# Patient Record
Sex: Female | Born: 1970 | Race: Black or African American | Hispanic: No | Marital: Single | Smoking: Never smoker
Health system: Southern US, Community
[De-identification: ages and names within clinical notes are randomized; demographics above are authoritative.]

## PROBLEM LIST (undated history)

## (undated) DIAGNOSIS — F319 Bipolar disorder, unspecified: Secondary | ICD-10-CM

## (undated) DIAGNOSIS — M779 Enthesopathy, unspecified: Secondary | ICD-10-CM

## (undated) DIAGNOSIS — I10 Essential (primary) hypertension: Secondary | ICD-10-CM

## (undated) DIAGNOSIS — K219 Gastro-esophageal reflux disease without esophagitis: Secondary | ICD-10-CM

## (undated) DIAGNOSIS — F419 Anxiety disorder, unspecified: Secondary | ICD-10-CM

## (undated) DIAGNOSIS — F329 Major depressive disorder, single episode, unspecified: Secondary | ICD-10-CM

## (undated) DIAGNOSIS — I499 Cardiac arrhythmia, unspecified: Secondary | ICD-10-CM

## (undated) DIAGNOSIS — F4310 Post-traumatic stress disorder, unspecified: Secondary | ICD-10-CM

---

## 1993-08-04 HISTORY — PX: TUBAL LIGATION: SHX77

## 2002-08-04 DIAGNOSIS — F4310 Post-traumatic stress disorder, unspecified: Secondary | ICD-10-CM

## 2002-08-04 DIAGNOSIS — F419 Anxiety disorder, unspecified: Secondary | ICD-10-CM

## 2002-08-04 DIAGNOSIS — F319 Bipolar disorder, unspecified: Secondary | ICD-10-CM

## 2002-08-04 DIAGNOSIS — F32A Depression, unspecified: Secondary | ICD-10-CM

## 2002-08-04 HISTORY — DX: Depression, unspecified: F32.A

## 2002-08-04 HISTORY — DX: Post-traumatic stress disorder, unspecified: F43.10

## 2002-08-04 HISTORY — DX: Anxiety disorder, unspecified: F41.9

## 2002-08-04 HISTORY — DX: Bipolar disorder, unspecified: F31.9

## 2003-05-04 ENCOUNTER — Encounter: Admission: RE | Admit: 2003-05-04 | Discharge: 2003-05-04 | Payer: Self-pay | Admitting: Family Medicine

## 2003-08-09 ENCOUNTER — Inpatient Hospital Stay (HOSPITAL_COMMUNITY): Admission: AD | Admit: 2003-08-09 | Discharge: 2003-08-09 | Payer: Self-pay | Admitting: Obstetrics and Gynecology

## 2003-11-29 ENCOUNTER — Encounter: Admission: RE | Admit: 2003-11-29 | Discharge: 2003-11-29 | Payer: Self-pay | Admitting: Family Medicine

## 2004-03-19 ENCOUNTER — Encounter (INDEPENDENT_AMBULATORY_CARE_PROVIDER_SITE_OTHER): Payer: Self-pay | Admitting: *Deleted

## 2004-03-19 ENCOUNTER — Ambulatory Visit (HOSPITAL_COMMUNITY): Admission: RE | Admit: 2004-03-19 | Discharge: 2004-03-19 | Payer: Self-pay | Admitting: Orthopedic Surgery

## 2004-03-19 ENCOUNTER — Ambulatory Visit (HOSPITAL_BASED_OUTPATIENT_CLINIC_OR_DEPARTMENT_OTHER): Admission: RE | Admit: 2004-03-19 | Discharge: 2004-03-19 | Payer: Self-pay | Admitting: Orthopedic Surgery

## 2006-04-05 ENCOUNTER — Inpatient Hospital Stay (HOSPITAL_COMMUNITY): Admission: AD | Admit: 2006-04-05 | Discharge: 2006-04-05 | Payer: Self-pay | Admitting: Obstetrics and Gynecology

## 2006-05-14 ENCOUNTER — Ambulatory Visit: Payer: Self-pay | Admitting: Family Medicine

## 2006-06-04 ENCOUNTER — Ambulatory Visit: Payer: Self-pay | Admitting: Family Medicine

## 2006-06-23 ENCOUNTER — Ambulatory Visit (HOSPITAL_COMMUNITY): Admission: RE | Admit: 2006-06-23 | Discharge: 2006-06-23 | Payer: Self-pay | Admitting: Gynecology

## 2006-06-29 ENCOUNTER — Ambulatory Visit: Payer: Self-pay | Admitting: Gynecology

## 2006-07-30 ENCOUNTER — Ambulatory Visit: Payer: Self-pay | Admitting: Family Medicine

## 2006-08-24 ENCOUNTER — Ambulatory Visit: Payer: Self-pay | Admitting: Obstetrics & Gynecology

## 2006-09-07 ENCOUNTER — Ambulatory Visit: Payer: Self-pay | Admitting: Obstetrics & Gynecology

## 2006-09-28 ENCOUNTER — Ambulatory Visit: Payer: Self-pay | Admitting: Obstetrics & Gynecology

## 2006-10-01 DIAGNOSIS — F319 Bipolar disorder, unspecified: Secondary | ICD-10-CM

## 2006-10-12 ENCOUNTER — Ambulatory Visit: Payer: Self-pay | Admitting: Obstetrics & Gynecology

## 2006-10-26 ENCOUNTER — Ambulatory Visit: Payer: Self-pay | Admitting: Family Medicine

## 2006-11-02 ENCOUNTER — Ambulatory Visit: Payer: Self-pay | Admitting: Family Medicine

## 2006-11-09 ENCOUNTER — Ambulatory Visit: Payer: Self-pay | Admitting: Obstetrics & Gynecology

## 2006-11-14 ENCOUNTER — Ambulatory Visit: Payer: Self-pay | Admitting: Obstetrics & Gynecology

## 2006-11-14 ENCOUNTER — Inpatient Hospital Stay (HOSPITAL_COMMUNITY): Admission: AD | Admit: 2006-11-14 | Discharge: 2006-11-16 | Payer: Self-pay | Admitting: Gynecology

## 2007-04-01 ENCOUNTER — Ambulatory Visit: Payer: Self-pay | Admitting: Gynecology

## 2007-04-02 ENCOUNTER — Ambulatory Visit (HOSPITAL_COMMUNITY): Admission: RE | Admit: 2007-04-02 | Discharge: 2007-04-02 | Payer: Self-pay | Admitting: Family Medicine

## 2007-04-02 ENCOUNTER — Ambulatory Visit: Payer: Self-pay | Admitting: Family Medicine

## 2007-04-02 ENCOUNTER — Telehealth (INDEPENDENT_AMBULATORY_CARE_PROVIDER_SITE_OTHER): Payer: Self-pay | Admitting: *Deleted

## 2007-04-02 DIAGNOSIS — R079 Chest pain, unspecified: Secondary | ICD-10-CM | POA: Insufficient documentation

## 2007-04-06 ENCOUNTER — Encounter (INDEPENDENT_AMBULATORY_CARE_PROVIDER_SITE_OTHER): Payer: Self-pay | Admitting: Family Medicine

## 2007-04-15 ENCOUNTER — Encounter (INDEPENDENT_AMBULATORY_CARE_PROVIDER_SITE_OTHER): Payer: Self-pay | Admitting: Family Medicine

## 2007-05-06 ENCOUNTER — Encounter: Payer: Self-pay | Admitting: *Deleted

## 2007-08-04 ENCOUNTER — Ambulatory Visit: Payer: Self-pay | Admitting: Family Medicine

## 2007-08-04 ENCOUNTER — Encounter: Payer: Self-pay | Admitting: *Deleted

## 2007-11-26 ENCOUNTER — Emergency Department (HOSPITAL_COMMUNITY): Admission: EM | Admit: 2007-11-26 | Discharge: 2007-11-26 | Payer: Self-pay | Admitting: Emergency Medicine

## 2008-04-17 ENCOUNTER — Ambulatory Visit: Payer: Self-pay | Admitting: Family Medicine

## 2008-04-17 ENCOUNTER — Other Ambulatory Visit: Admission: RE | Admit: 2008-04-17 | Discharge: 2008-04-17 | Payer: Self-pay | Admitting: Family Medicine

## 2008-04-17 ENCOUNTER — Encounter (INDEPENDENT_AMBULATORY_CARE_PROVIDER_SITE_OTHER): Payer: Self-pay | Admitting: Family Medicine

## 2008-04-17 ENCOUNTER — Encounter: Payer: Self-pay | Admitting: Family Medicine

## 2008-04-17 LAB — CONVERTED CEMR LAB
Beta hcg, urine, semiquantitative: NEGATIVE
HCT: 38.7 % (ref 36.0–46.0)
Hemoglobin: 12.2 g/dL (ref 12.0–15.0)
MCHC: 31.5 g/dL (ref 30.0–36.0)
MCV: 89.6 fL (ref 78.0–100.0)
Platelets: 304 10*3/uL (ref 150–400)
RBC: 4.32 M/uL (ref 3.87–5.11)
RDW: 14.3 % (ref 11.5–15.5)
TSH: 2.177 microintl units/mL (ref 0.350–4.50)
WBC: 5.3 10*3/uL (ref 4.0–10.5)

## 2008-04-18 ENCOUNTER — Encounter: Payer: Self-pay | Admitting: *Deleted

## 2008-04-21 ENCOUNTER — Encounter (INDEPENDENT_AMBULATORY_CARE_PROVIDER_SITE_OTHER): Payer: Self-pay | Admitting: *Deleted

## 2008-05-19 ENCOUNTER — Encounter (INDEPENDENT_AMBULATORY_CARE_PROVIDER_SITE_OTHER): Payer: Self-pay | Admitting: *Deleted

## 2008-05-30 ENCOUNTER — Telehealth (INDEPENDENT_AMBULATORY_CARE_PROVIDER_SITE_OTHER): Payer: Self-pay | Admitting: Family Medicine

## 2008-06-06 ENCOUNTER — Encounter (INDEPENDENT_AMBULATORY_CARE_PROVIDER_SITE_OTHER): Payer: Self-pay | Admitting: Family Medicine

## 2008-06-06 ENCOUNTER — Ambulatory Visit: Payer: Self-pay | Admitting: Family Medicine

## 2008-06-06 LAB — CONVERTED CEMR LAB
Chlamydia, DNA Probe: NEGATIVE
GC Probe Amp, Genital: NEGATIVE
Whiff Test: POSITIVE

## 2008-06-07 ENCOUNTER — Encounter (INDEPENDENT_AMBULATORY_CARE_PROVIDER_SITE_OTHER): Payer: Self-pay | Admitting: Family Medicine

## 2009-04-17 ENCOUNTER — Telehealth: Payer: Self-pay | Admitting: Family Medicine

## 2009-04-20 ENCOUNTER — Ambulatory Visit: Payer: Self-pay | Admitting: Family Medicine

## 2009-05-04 ENCOUNTER — Encounter: Payer: Self-pay | Admitting: Family Medicine

## 2009-05-04 ENCOUNTER — Other Ambulatory Visit: Admission: RE | Admit: 2009-05-04 | Discharge: 2009-05-04 | Payer: Self-pay | Admitting: Family Medicine

## 2009-05-04 ENCOUNTER — Ambulatory Visit: Payer: Self-pay | Admitting: Family Medicine

## 2009-05-04 DIAGNOSIS — R102 Pelvic and perineal pain: Secondary | ICD-10-CM

## 2009-05-04 DIAGNOSIS — G8929 Other chronic pain: Secondary | ICD-10-CM

## 2009-05-04 LAB — CONVERTED CEMR LAB
BUN: 6 mg/dL (ref 6–23)
Beta hcg, urine, semiquantitative: NEGATIVE
CO2: 25 meq/L (ref 19–32)
Calcium: 9.8 mg/dL (ref 8.4–10.5)
Chlamydia, DNA Probe: NEGATIVE
Chloride: 103 meq/L (ref 96–112)
Creatinine, Ser: 0.99 mg/dL (ref 0.40–1.20)
GC Probe Amp, Genital: NEGATIVE
Glucose, Bld: 95 mg/dL (ref 70–99)
HCT: 37 % (ref 36.0–46.0)
Hemoglobin: 11.9 g/dL — ABNORMAL LOW (ref 12.0–15.0)
MCHC: 32.2 g/dL (ref 30.0–36.0)
MCV: 87.3 fL (ref 78.0–100.0)
Platelets: 281 10*3/uL (ref 150–400)
Potassium: 4.7 meq/L (ref 3.5–5.3)
RBC: 4.24 M/uL (ref 3.87–5.11)
RDW: 15.7 % — ABNORMAL HIGH (ref 11.5–15.5)
Sodium: 137 meq/L (ref 135–145)
WBC: 4.7 10*3/uL (ref 4.0–10.5)
Whiff Test: POSITIVE

## 2009-05-06 ENCOUNTER — Encounter: Payer: Self-pay | Admitting: Family Medicine

## 2009-05-10 ENCOUNTER — Encounter: Payer: Self-pay | Admitting: Family Medicine

## 2009-05-10 ENCOUNTER — Ambulatory Visit (HOSPITAL_COMMUNITY): Admission: RE | Admit: 2009-05-10 | Discharge: 2009-05-10 | Payer: Self-pay | Admitting: Family Medicine

## 2009-05-30 ENCOUNTER — Telehealth: Payer: Self-pay | Admitting: Family Medicine

## 2009-08-20 ENCOUNTER — Telehealth: Payer: Self-pay | Admitting: Family Medicine

## 2009-08-21 ENCOUNTER — Ambulatory Visit: Payer: Self-pay | Admitting: Family Medicine

## 2009-08-21 ENCOUNTER — Encounter: Payer: Self-pay | Admitting: Family Medicine

## 2009-08-21 ENCOUNTER — Ambulatory Visit (HOSPITAL_COMMUNITY)
Admission: RE | Admit: 2009-08-21 | Discharge: 2009-08-21 | Payer: Self-pay | Source: Home / Self Care | Admitting: Family Medicine

## 2009-08-21 DIAGNOSIS — R0789 Other chest pain: Secondary | ICD-10-CM | POA: Insufficient documentation

## 2009-08-23 ENCOUNTER — Telehealth: Payer: Self-pay | Admitting: Family Medicine

## 2009-11-26 ENCOUNTER — Telehealth: Payer: Self-pay | Admitting: Family Medicine

## 2009-11-26 ENCOUNTER — Ambulatory Visit: Payer: Self-pay | Admitting: Family Medicine

## 2010-08-12 ENCOUNTER — Inpatient Hospital Stay (HOSPITAL_COMMUNITY)
Admission: AD | Admit: 2010-08-12 | Discharge: 2010-08-12 | Payer: Self-pay | Source: Home / Self Care | Attending: Obstetrics and Gynecology | Admitting: Obstetrics and Gynecology

## 2010-08-17 ENCOUNTER — Emergency Department (HOSPITAL_COMMUNITY)
Admission: EM | Admit: 2010-08-17 | Discharge: 2010-08-17 | Payer: Self-pay | Source: Home / Self Care | Admitting: Emergency Medicine

## 2010-08-19 LAB — POCT PREGNANCY, URINE: Preg Test, Ur: NEGATIVE

## 2010-08-19 LAB — URINE MICROSCOPIC-ADD ON

## 2010-08-19 LAB — GC/CHLAMYDIA PROBE AMP, GENITAL
Chlamydia, DNA Probe: NEGATIVE
GC Probe Amp, Genital: POSITIVE — AB

## 2010-08-19 LAB — URINALYSIS, ROUTINE W REFLEX MICROSCOPIC
Bilirubin Urine: NEGATIVE
Hgb urine dipstick: NEGATIVE
Ketones, ur: 15 mg/dL — AB
Nitrite: POSITIVE — AB
Protein, ur: 100 mg/dL — AB
Specific Gravity, Urine: 1.025 (ref 1.005–1.030)
Urine Glucose, Fasting: 250 mg/dL — AB
Urobilinogen, UA: >8 mg/dL — ABNORMAL HIGH (ref 0.0–1.0)
pH: 5 (ref 5.0–8.0)

## 2010-08-19 LAB — WET PREP, GENITAL
Trich, Wet Prep: NONE SEEN
Yeast Wet Prep HPF POC: NONE SEEN

## 2010-08-19 LAB — GLUCOSE, CAPILLARY: Glucose-Capillary: 98 mg/dL (ref 70–99)
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
## 2010-09-03 NOTE — Progress Notes (Signed)
Summary: triage  Phone Note Call from Patient Call back at [phone]   Caller: Patient Summary of Call: Has cold asking to be seen today. Initial call taken by: Clydell Hakim,  November 26, 2009 11:05 AM  Follow-up for Phone Call        COLD SYMPTOMS X 3 WEEKS. work in at [employer]. aware of wait Follow-up by: Golden Circle RN,  November 26, 2009 11:08 AM
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
## 2010-09-03 NOTE — Progress Notes (Signed)
Summary: x-ray results  Phone Note Call from Patient Call back at [phone]   Caller: Patient Summary of Call: Pt checking on results of x-ray. Initial call taken by: Clydell Hakim,  August 23, 2009 3:46 PM  Follow-up for Phone Call        will forward to MD. Follow-up by: Theresia Lo RN,  August 23, 2009 3:55 PM  Additional Follow-up for Phone Call Additional follow up Details #1::        called patient and discussed xray report. Additional Follow-up by: Marisue Ivan  MD,  August 23, 2009 5:36 PM

## 2010-09-03 NOTE — Progress Notes (Signed)
Summary: triage  Phone Note Call from Patient Call back at [phone]   Caller: Patient Summary of Call: Pt has real bad cough and wondering what she should do about it? Initial call taken by: Clydell Hakim,  August 20, 2009 4:28 PM  Follow-up for Phone Call        cough x 3 days. taking otc & rx cough meds. to see pcp tomorrow am. told her she can sips fluids frequently, suck on hard candy or cough drops. 1 teaspoon hiney every 4 hours Follow-up by: Golden Circle RN,  August 20, 2009 4:30 PM

## 2010-10-23 ENCOUNTER — Encounter: Payer: Self-pay | Admitting: Family Medicine

## 2010-11-06 ENCOUNTER — Encounter: Payer: Self-pay | Admitting: Family Medicine

## 2010-11-06 ENCOUNTER — Ambulatory Visit (INDEPENDENT_AMBULATORY_CARE_PROVIDER_SITE_OTHER): Payer: Medicaid Other | Admitting: Family Medicine

## 2010-11-06 VITALS — BP 113/80 | HR 97 | Temp 97.9°F | Ht 66.6 in | Wt 172.0 lb

## 2010-11-06 DIAGNOSIS — E663 Overweight: Secondary | ICD-10-CM | POA: Insufficient documentation

## 2010-11-06 DIAGNOSIS — N760 Acute vaginitis: Secondary | ICD-10-CM

## 2010-11-06 DIAGNOSIS — Z01419 Encounter for gynecological examination (general) (routine) without abnormal findings: Secondary | ICD-10-CM

## 2010-11-06 DIAGNOSIS — N920 Excessive and frequent menstruation with regular cycle: Secondary | ICD-10-CM

## 2010-11-06 DIAGNOSIS — F319 Bipolar disorder, unspecified: Secondary | ICD-10-CM

## 2010-11-06 DIAGNOSIS — Z113 Encounter for screening for infections with a predominantly sexual mode of transmission: Secondary | ICD-10-CM

## 2010-11-06 DIAGNOSIS — R109 Unspecified abdominal pain: Secondary | ICD-10-CM

## 2010-11-06 DIAGNOSIS — Z309 Encounter for contraceptive management, unspecified: Secondary | ICD-10-CM

## 2010-11-06 LAB — COMPREHENSIVE METABOLIC PANEL
ALT: 11 U/L (ref 0–35)
AST: 14 U/L (ref 0–37)
Albumin: 3.6 g/dL (ref 3.5–5.2)
Alkaline Phosphatase: 68 U/L (ref 39–117)
BUN: 11 mg/dL (ref 6–23)
CO2: 21 mEq/L (ref 19–32)
Calcium: 8.9 mg/dL (ref 8.4–10.5)
Chloride: 110 mEq/L (ref 96–112)
Creat: 0.88 mg/dL (ref 0.40–1.20)
Glucose, Bld: 86 mg/dL (ref 70–99)
Potassium: 4.2 mEq/L (ref 3.5–5.3)
Sodium: 140 mEq/L (ref 135–145)
Total Bilirubin: 0.1 mg/dL — ABNORMAL LOW (ref 0.3–1.2)
Total Protein: 5.9 g/dL — ABNORMAL LOW (ref 6.0–8.3)

## 2010-11-06 LAB — POCT URINE PREGNANCY: Preg Test, Ur: NEGATIVE

## 2010-11-06 LAB — LIPID PANEL
Cholesterol: 151 mg/dL (ref 0–200)
HDL: 56 mg/dL (ref >39)
LDL Cholesterol: 72 mg/dL (ref 0–99)
Total CHOL/HDL Ratio: 2.7 Ratio
Triglycerides: 113 mg/dL (ref <150)
VLDL: 23 mg/dL (ref 0–40)

## 2010-11-06 LAB — CBC
HCT: 35 % — ABNORMAL LOW (ref 36.0–46.0)
Hemoglobin: 11 g/dL — ABNORMAL LOW (ref 12.0–15.0)
MCH: 27.2 pg (ref 26.0–34.0)
MCHC: 31.4 g/dL (ref 30.0–36.0)
MCV: 86.4 fL (ref 78.0–100.0)
Platelets: 223 10*3/uL (ref 150–400)
RBC: 4.05 MIL/uL (ref 3.87–5.11)
RDW: 19.7 % — ABNORMAL HIGH (ref 11.5–15.5)
WBC: 7.1 10*3/uL (ref 4.0–10.5)

## 2010-11-06 LAB — RPR

## 2010-11-06 LAB — TSH: TSH: 2.279 u[IU]/mL (ref 0.350–4.500)

## 2010-11-06 MED ORDER — NORGESTIMATE-ETH ESTRADIOL 0.25-35 MG-MCG PO TABS: 1.0000 | ORAL_TABLET | Freq: Every day | ORAL | Status: DC

## 2010-11-06 NOTE — Assessment & Plan Note (Signed)
Norma pap less than 2 years ago.  Will repeat at next annual gynecological visit.

## 2010-11-06 NOTE — Assessment & Plan Note (Signed)
History of Mirena intolerance due to breakthrough bleeding.  Will start OCP for regulation of heavy menstrual cycle as well as patient states her last child was s/p BTL and desires contraception.

## 2010-11-06 NOTE — Patient Instructions (Addendum)
Keep up on the exercise and work on healthy nutrition  We talked about the risks of illegal drugs today New birth control- will be sent to your pharmacy Use condoms- birth control or BTL does not protect against sexually transmitted diseases

## 2010-11-06 NOTE — Progress Notes (Signed)
  Subjective:    Patient ID: Krystal West, female    DOB: 1971/05/02, 40 y.o.   MRN: [id number]  HPI Annual Gynecological Exam  [id number] Wt Readings from Last 3 Encounters:  11/06/10 172 lb (78.019 kg)  11/26/09 152 lb 8 oz (69.174 kg)  08/21/09 157 lb 11.2 oz (71.532 kg)   Last period: 10/30/2010- March 16th Regular periods: yes motnhly Heavy bleeding: yes, 10 days, clots  Sexually active: yes Birth control or hormonal therapy:no, Hx of STD: Patient desires STD screening Vaginal discharge:no  Dysuria:No   Last mammogram: no Breast mass or concerns: No  Last Pap: October 2010, no abnormals History of abnormal pap: No   FH of breast, uterine, ovarian, colon cancer:   Desires birth control because has been on mirena and had pelvic pain and bleeding even after 6 months.  Forgets to take birth control pills.  States needs irth control even with  BTL as her last child was post BTL   Review of Systems See HPI    Objective:   Physical Exam  Constitutional: She is oriented to person, place, and time. She appears well-developed and well-nourished.  HENT:  Head: Normocephalic and atraumatic.  Right Ear: External ear normal.  Left Ear: External ear normal.  Nose: Nose normal.  Mouth/Throat: Oropharynx is clear and moist. No oropharyngeal exudate.  Eyes: Conjunctivae and EOM are normal. Pupils are equal, round, and reactive to light.  Neck: Normal range of motion. Neck supple. No thyromegaly present.  Cardiovascular: Normal rate, regular rhythm and normal heart sounds.   No murmur heard. Pulmonary/Chest: Effort normal and breath sounds normal. No respiratory distress. She has no wheezes. She has no rhonchi. She has no rales.  Abdominal: Soft. Bowel sounds are normal. There is no hepatosplenomegaly. There is no tenderness. There is no rebound and no guarding.  Genitourinary: Vagina normal and uterus normal. No breast tenderness or discharge. Cervix exhibits no motion  tenderness, no discharge and no friability. Right adnexum displays no mass, no tenderness and no fullness. Left adnexum displays no mass and no tenderness. No bleeding around the vagina. No vaginal discharge found.  Musculoskeletal: She exhibits no edema.  Lymphadenopathy:    She has no cervical adenopathy.  Neurological: She is alert and oriented to person, place, and time.  Psychiatric: She has a normal mood and affect.          Assessment & Plan:

## 2010-11-06 NOTE — Assessment & Plan Note (Signed)
Recent weight gain due to increase an seroquel and other psychiatric medications per patient .  Encouraged to continue taking them ut to be mindful of increased appetite and to continue physical exercise.  Will check Lipid and fasting glucose as at risk for metabolic syndrome.

## 2010-11-06 NOTE — Progress Notes (Signed)
Pt is wanting to start birth control

## 2010-11-07 LAB — HIV ANTIBODY (ROUTINE TESTING W REFLEX): HIV: NONREACTIVE

## 2010-11-07 LAB — GC/CHLAMYDIA PROBE AMP, GENITAL
Chlamydia, DNA Probe: NEGATIVE
GC Probe Amp, Genital: NEGATIVE

## 2010-11-15 ENCOUNTER — Telehealth: Payer: Self-pay | Admitting: Family Medicine

## 2010-11-15 NOTE — Telephone Encounter (Signed)
Wants to know results of last week

## 2010-11-18 NOTE — Telephone Encounter (Signed)
Pt is still waiting for her results.  It's going on 2 wks and she is quite anxious about them.  Please return call for this asap

## 2010-11-18 NOTE — Telephone Encounter (Signed)
I won't be in the office today or tomorrow.  Would you please call patient and let her know all labs are normal  including her thyroid, liver function, HIV, syphilis, Gonorrhea, chlamydia, negative pregnancy.  She was minimally anemic as expected due to heavy periods.  Can take a daily multivitamin and we will recheck at some point in the future.  Thanks

## 2010-11-19 NOTE — Telephone Encounter (Signed)
LVM for patient to call back to inform of below 

## 2010-11-19 NOTE — Telephone Encounter (Signed)
Called pt again and lvm.Krystal West

## 2010-11-19 NOTE — Telephone Encounter (Signed)
Pt informed of results.Krystal West  

## 2010-11-27 ENCOUNTER — Telehealth: Payer: Self-pay | Admitting: Family Medicine

## 2010-11-27 NOTE — Telephone Encounter (Signed)
Has taken the 3 wks of active pills. Told her to start the inactive pills & I will call her with md response

## 2010-11-27 NOTE — Telephone Encounter (Signed)
Is on Sprintec and she has been bleeding almost 3 weeks.  Not sure what to do.

## 2010-12-02 NOTE — Telephone Encounter (Signed)
Gave pt md response. She was ok with the answer

## 2010-12-02 NOTE — Telephone Encounter (Signed)
Agreed.  She should continue to take pills, and I would expect to see lighter periods.  Stress importance of not missing days and taking pills at roughly the same time daily as both can cause unscheduled bleeding.

## 2010-12-10 ENCOUNTER — Encounter: Payer: Self-pay | Admitting: Family Medicine

## 2010-12-10 ENCOUNTER — Ambulatory Visit (INDEPENDENT_AMBULATORY_CARE_PROVIDER_SITE_OTHER): Payer: Medicaid Other | Admitting: Family Medicine

## 2010-12-10 VITALS — BP 110/76 | Temp 98.4°F | Ht 66.5 in | Wt 168.0 lb

## 2010-12-10 DIAGNOSIS — Z309 Encounter for contraceptive management, unspecified: Secondary | ICD-10-CM

## 2010-12-10 DIAGNOSIS — J329 Chronic sinusitis, unspecified: Secondary | ICD-10-CM

## 2010-12-10 DIAGNOSIS — IMO0001 Reserved for inherently not codable concepts without codable children: Secondary | ICD-10-CM

## 2010-12-10 DIAGNOSIS — S30810A Abrasion of lower back and pelvis, initial encounter: Secondary | ICD-10-CM

## 2010-12-10 DIAGNOSIS — E663 Overweight: Secondary | ICD-10-CM

## 2010-12-10 MED ORDER — DOXYCYCLINE HYCLATE 100 MG PO TABS: 100.0000 mg | ORAL_TABLET | Freq: Two times a day (BID) | ORAL | Status: AC

## 2010-12-10 MED ORDER — NYSTATIN-TRIAMCINOLONE 100000-0.1 UNIT/GM-% EX OINT: TOPICAL_OINTMENT | Freq: Two times a day (BID) | CUTANEOUS | Status: DC

## 2010-12-10 NOTE — Patient Instructions (Addendum)
   For the bleeding give it 3 months, do not miss pills, if still bleeding then, follow up with Dr. Earnest Bailey For you sinus infection use the antibiotic for 10 days.  For the skin irritation use the ointment bid until it heals

## 2010-12-10 NOTE — Progress Notes (Signed)
  Subjective:    Patient ID: Krystal West, female    DOB: 1971/03/23, 40 y.o.   MRN: [id number]  HPI  Coughing for one month, productive greenish and sometimes brown.  Children have been sick but improved. Does not smoke.  Noticed some swelling around knees on both legs.  Has been exercising to loose weight.  Scratched skin around her rectum and now painful and burning.  Started OPC last month and has been spotting and bleeding since, started with menses.    Review of Systems  Constitutional: Negative for fever and unexpected weight change.  HENT: Positive for postnasal drip and sinus pressure. Negative for ear pain, congestion and rhinorrhea.   Respiratory: Positive for cough. Negative for shortness of breath and wheezing.   Cardiovascular: Negative for chest pain and leg swelling.  Musculoskeletal: Negative for arthralgias.       Objective:   Physical Exam  Constitutional: She appears well-developed and well-nourished.  HENT:       TM retracted,  + post nasal drainage  Cardiovascular: Normal rate and regular rhythm.   Pulmonary/Chest: Effort normal and breath sounds normal.  Genitourinary:       Excoriation of rectal area, no obvious tags, stool heme negative  Musculoskeletal: Normal range of motion.       Tissue bogginess around the knees, non pitting, normal ROM, normal joint exam  Lymphadenopathy:    She has no cervical adenopathy.          Assessment & Plan:

## 2010-12-11 DIAGNOSIS — IMO0001 Reserved for inherently not codable concepts without codable children: Secondary | ICD-10-CM | POA: Insufficient documentation

## 2010-12-11 NOTE — Assessment & Plan Note (Signed)
Explained need to stay with the plan for 3 months, and spotting will likely stop, return if it does not.

## 2010-12-11 NOTE — Assessment & Plan Note (Signed)
Doxycycline 100 mg bid for 10 days

## 2010-12-11 NOTE — Assessment & Plan Note (Signed)
Topical antifungal/cortisone ointment for one week

## 2010-12-11 NOTE — Assessment & Plan Note (Signed)
Bogginess around knees appeared to be fat pads, they were symmetrical and soft, in the face of a normal joint exam

## 2010-12-20 NOTE — Op Note (Signed)
Krystal West, Krystal West                       ACCOUNT NO.:  [id number]   MEDICAL RECORD NO.:  [id number]                   PATIENT TYPE:  AMB   LOCATION:  [facility]                                  FACILITY:  [facility]   PHYSICIAN:  Cindee Salt, M.D.                    DATE OF BIRTH:  [birth year]   DATE OF PROCEDURE:  03/19/2004  DATE OF DISCHARGE:                                 OPERATIVE REPORT   PREOPERATIVE DIAGNOSIS:  Cyst volar radial aspect right wrist.   POSTOPERATIVE DIAGNOSIS:  Cyst volar radial aspect right wrist.   OPERATION:  Excision cyst volar radial aspect right wrist.   SURGEON:  Kuzma.   ASSISTANT:  __________.   ANESTHESIA:  General.   HISTORY:  The patient is a 40 year old female with a history of a mass on  the volar radial aspect of her right wrist which is painful for her.   PROCEDURE:  The patient is brought to the operating room where a general  anesthetic was carried out without difficulty.  She was prepped and draped  using DuraPrep, supine position, right arm free.  The limb was exsanguinated  with an Esmarch bandage, tourniquet placed high and the arm was inflated to  250 mmHg.  A volar hockey stick incision was made over the mass, carried  through subcutaneous tissue.  Bleeders were electrocauterized.  The  dissection was carried down to a multilobulated cystic structure.  With  blunt and sharp dissection this was dissected free, protecting the  superficial radial artery.  This was found to follow up along the flexor  carpi radialis sheath.  This was followed distally, excising the entire  cyst.  This was sent to pathology. This appeared arise from the __________  on the volar aspect and this was excised, the area opened, minimally  debrided.  The wound was irrigated.  The skin was closed with interrupted 5-  0 nylon sutures.  A sterile compressive dressing and thumb spica splint  applied.  The patient tolerated the procedure well and was taken to the  recovery room for observation in satisfactory condition.  She is discharged  home, to return to __________ [facility] in one week, on Vicodin.                                               Cindee Salt, M.D.    GK/MEDQ  D:  03/19/2004  T:  03/19/2004  Job:  [id number]

## 2010-12-27 ENCOUNTER — Telehealth: Payer: Self-pay | Admitting: Family Medicine

## 2010-12-27 NOTE — Telephone Encounter (Signed)
Pt is returning someone's call, not sure who called her.  °

## 2010-12-27 NOTE — Telephone Encounter (Signed)
No one called her from our office.Krystal West, Krystal West

## 2011-01-13 ENCOUNTER — Ambulatory Visit: Payer: Medicaid Other | Admitting: Family Medicine

## 2011-02-19 ENCOUNTER — Encounter: Payer: Self-pay | Admitting: Family Medicine

## 2011-02-19 ENCOUNTER — Ambulatory Visit (INDEPENDENT_AMBULATORY_CARE_PROVIDER_SITE_OTHER): Payer: Medicaid Other | Admitting: Family Medicine

## 2011-02-19 DIAGNOSIS — M7750 Other enthesopathy of unspecified foot: Secondary | ICD-10-CM

## 2011-02-19 DIAGNOSIS — M7740 Metatarsalgia, unspecified foot: Secondary | ICD-10-CM

## 2011-02-19 NOTE — Progress Notes (Signed)
  Subjective:    Patient ID: Krystal West, female    DOB: 1971-05-13, 40 y.o.   MRN: [id number]  HPI Foot pain bilateral: X 2 weeks, worse at night, right foot hurts worse than left.  Pain is located on bottom of foot, most intense on the ball of the foot but radiates to the middle of the sole of the foot as well.  Worse with prolonged standing.  Improved with sitting and putting feet up.  Pt has taken friends vicodin and oxycodone b/c pain has been so severe.  No trauma to area.  No redness. No swelling.  No fever. No lesions. No rashes  Review of Systems As per above    Objective:   Physical Exam  Musculoskeletal:       Pain on palpation on ball of forefoot bilateral.  No redness. No swelling.  No lesions. No rashes.  Sensation grossly intact.  Full rom bilateral.  Collapse of transverse arch of forefoot bilateral.           Assessment & Plan:

## 2011-02-23 DIAGNOSIS — M7740 Metatarsalgia, unspecified foot: Secondary | ICD-10-CM | POA: Insufficient documentation

## 2011-02-23 NOTE — Assessment & Plan Note (Signed)
Pt instructed to buy metatarsalgia pad at supply store (not in stock in our office), pt to take tylenol scheduled and tramadol for breakthrough pain.

## 2011-07-02 ENCOUNTER — Encounter: Payer: Self-pay | Admitting: Family Medicine

## 2011-07-02 ENCOUNTER — Ambulatory Visit (INDEPENDENT_AMBULATORY_CARE_PROVIDER_SITE_OTHER): Payer: Medicaid Other | Admitting: Family Medicine

## 2011-07-02 VITALS — BP 126/91 | HR 76 | Temp 98.5°F | Ht 66.5 in | Wt 187.8 lb

## 2011-07-02 DIAGNOSIS — M7750 Other enthesopathy of unspecified foot: Secondary | ICD-10-CM

## 2011-07-02 DIAGNOSIS — M7740 Metatarsalgia, unspecified foot: Secondary | ICD-10-CM

## 2011-07-02 MED ORDER — IBUPROFEN 600 MG PO TABS: 600.0000 mg | ORAL_TABLET | Freq: Three times a day (TID) | ORAL | Status: DC | PRN

## 2011-07-02 NOTE — Progress Notes (Signed)
  Subjective:    Patient ID: Krystal West, female    DOB: 30-Apr-1971, 40 y.o.   MRN: [id number]  HPI  FOOT PAIN Location: bilateral base of toes   Course:  Comes and goes has had before. Worse with:  walking Better with:  Taking her friends pain pills not sure what they were Trauma: no Swelling:  no Locking:  no Other Joints involved:  no Rash: no Fever:  No Wears flat shoes without insoles, no high heels  Review of Symptoms - see HPI  PMH - Smoking status noted.      Review of Systems     Objective:   Physical Exam  Tender over metatarsals No deformity but does have flat spread feet over metatarsal region No soft tissue swelling       Assessment & Plan:

## 2011-07-02 NOTE — Assessment & Plan Note (Signed)
No signs of trauma or DJD.   Her current shoes have no support.  Emphasized to wear supports all the time and as needed ibuprofen.

## 2011-07-02 NOTE — Patient Instructions (Signed)
You have metatarsalgia  The best treatment is arch support insoles - Dr Jabier Mutton or a similar brand  Wear them all the time   Use the ibuprofen as needed for pain

## 2011-08-21 ENCOUNTER — Ambulatory Visit (INDEPENDENT_AMBULATORY_CARE_PROVIDER_SITE_OTHER): Payer: Medicaid Other | Admitting: Family Medicine

## 2011-08-21 ENCOUNTER — Encounter: Payer: Self-pay | Admitting: Family Medicine

## 2011-08-21 VITALS — BP 100/70 | HR 80 | Temp 97.5°F | Ht 66.5 in | Wt 190.2 lb

## 2011-08-21 DIAGNOSIS — IMO0001 Reserved for inherently not codable concepts without codable children: Secondary | ICD-10-CM

## 2011-08-21 DIAGNOSIS — B9689 Other specified bacterial agents as the cause of diseases classified elsewhere: Secondary | ICD-10-CM | POA: Insufficient documentation

## 2011-08-21 DIAGNOSIS — Z309 Encounter for contraceptive management, unspecified: Secondary | ICD-10-CM

## 2011-08-21 DIAGNOSIS — N760 Acute vaginitis: Secondary | ICD-10-CM | POA: Insufficient documentation

## 2011-08-21 DIAGNOSIS — A499 Bacterial infection, unspecified: Secondary | ICD-10-CM

## 2011-08-21 LAB — POCT WET PREP (WET MOUNT): Trichomonas Wet Prep HPF POC: NEGATIVE

## 2011-08-21 MED ORDER — METRONIDAZOLE 500 MG PO TABS: 500.0000 mg | ORAL_TABLET | Freq: Two times a day (BID) | ORAL | Status: AC

## 2011-08-21 MED ORDER — FLUCONAZOLE 150 MG PO TABS: 150.0000 mg | ORAL_TABLET | Freq: Once | ORAL | Status: AC

## 2011-08-21 MED ORDER — MEDROXYPROGESTERONE ACETATE 150 MG/ML IM SUSP
150.0000 mg | Freq: Once | INTRAMUSCULAR | Status: AC
  Administered 2011-08-21: 150 mg via INTRAMUSCULAR

## 2011-08-21 NOTE — Patient Instructions (Signed)
Depo for birth control  Will set you up with gynecologist to discuss permanent options for birth control  Use condoms every time- they protect you against infections

## 2011-08-21 NOTE — Assessment & Plan Note (Signed)
Discussed options with patient.  She had BTL and after that had an unplanned pregnancy.  She continues to desire permanent sterility and has failed Mirena and OCP.    Will refer to gyn to discuss options and in the mean time will give depo today until able to discuss long term options.

## 2011-08-21 NOTE — Assessment & Plan Note (Signed)
Gave patient info on BV and yeast vaginitis and will treat for both.  Discussed risky sexual behavior and need to evaluate HIV, RPR.  Has testing in 2012.

## 2011-08-21 NOTE — Progress Notes (Signed)
  Subjective:    Patient ID: Krystal West, female    DOB: November 26, 1970, 41 y.o.   MRN: [id number]  HPI  Work in for vaginal discharge  Vaginitis: 2 days, irritated labia, white discharge with odor.  No abdominal pain, fever, dyspareunia.  No recent abx.  Has had STD in past.  Has multiple partners,  Not using condoms consistently.  Contraception:  Has history of BTL, then had child after that.  Still desires permanent sterility.  Has failed Mirena and OCP due to irregular bleeding.  Review of Systems See HPI    Objective:   Physical Exam GEN: normal Pelvic Exam:        External: normal female genitalia without lesions or masses        Vagina: normal without lesions or masses        Cervix: normal without lesions or masses        Adnexa: normal bimanual exam without masses or fullness        Uterus: normal by palpation        Samples for Wet prep, GC/Chlamydia obtained        Assessment & Plan:

## 2011-08-21 NOTE — Progress Notes (Signed)
Addended by: Jimmy Footman K on: 08/21/2011 11:50 AM   Modules accepted: Orders

## 2011-08-25 ENCOUNTER — Encounter: Payer: Self-pay | Admitting: Family Medicine

## 2011-10-29 ENCOUNTER — Encounter: Payer: Medicaid Other | Admitting: Obstetrics and Gynecology

## 2011-11-13 ENCOUNTER — Other Ambulatory Visit: Payer: Self-pay | Admitting: Family Medicine

## 2012-05-13 ENCOUNTER — Telehealth: Payer: Self-pay | Admitting: Family Medicine

## 2012-05-13 NOTE — Telephone Encounter (Signed)
Will forward message to MD.

## 2012-05-13 NOTE — Telephone Encounter (Signed)
Medication not documented on pt's chart. Can not prescribe without seen pt.

## 2012-05-13 NOTE — Telephone Encounter (Signed)
Pt is needing a refill on nystatin-triamcinolone (MYCOLOG) ointment  She has an appt on 10/24 - but would like to have this before her appt for a problem with her backside   [facility]- [address]

## 2012-05-13 NOTE — Telephone Encounter (Signed)
Patient notified

## 2012-05-27 ENCOUNTER — Encounter: Payer: Medicaid Other | Admitting: Family Medicine

## 2012-06-08 ENCOUNTER — Encounter: Payer: Medicaid Other | Admitting: Family Medicine

## 2012-07-08 ENCOUNTER — Ambulatory Visit (INDEPENDENT_AMBULATORY_CARE_PROVIDER_SITE_OTHER): Payer: Medicaid Other | Admitting: Family Medicine

## 2012-07-08 ENCOUNTER — Telehealth: Payer: Self-pay | Admitting: Family Medicine

## 2012-07-08 VITALS — BP 107/68 | HR 76 | Temp 98.5°F | Ht 66.5 in | Wt 166.0 lb

## 2012-07-08 DIAGNOSIS — J069 Acute upper respiratory infection, unspecified: Secondary | ICD-10-CM | POA: Insufficient documentation

## 2012-07-08 DIAGNOSIS — N920 Excessive and frequent menstruation with regular cycle: Secondary | ICD-10-CM

## 2012-07-08 MED ORDER — ACETAMINOPHEN-CODEINE #3 300-30 MG PO TABS: 1.0000 | ORAL_TABLET | ORAL | Status: DC | PRN

## 2012-07-08 MED ORDER — MELOXICAM 7.5 MG PO TABS: 7.5000 mg | ORAL_TABLET | Freq: Every day | ORAL | Status: DC

## 2012-07-08 MED ORDER — GUAIFENESIN-CODEINE 200-20 MG/5ML PO LIQD: 5.0000 mL | Freq: Two times a day (BID) | ORAL | Status: DC | PRN

## 2012-07-08 MED ORDER — FLUTICASONE PROPIONATE 50 MCG/ACT NA SUSP: 2.0000 | Freq: Every day | NASAL | Status: DC

## 2012-07-08 NOTE — Assessment & Plan Note (Signed)
No red flags on history or exam.  Rx for cough medication with codeine, nasal steroids.  Discussed symptomatic care.

## 2012-07-08 NOTE — Patient Instructions (Signed)

## 2012-07-08 NOTE — Assessment & Plan Note (Signed)
Will try Meloxicam, advised that NSAIDS are best medication for this problem.

## 2012-07-08 NOTE — Telephone Encounter (Signed)
Please phone in tylenol #3

## 2012-07-08 NOTE — Telephone Encounter (Signed)
Patient is calling because the cough medicine that was prescribed is not covered by Medicaid and she would like something else called in that is.

## 2012-07-08 NOTE — Progress Notes (Signed)
  Subjective:    Patient ID: Georgana Curio, female    DOB: [birth year], 41 y.o.   MRN: [id number]  HPI  Everly comes in for cold symptoms that have been going on for 3 weeks.  She says her daughter has also been sick but got better.  Celestina complains of nasal congestion, cough productive of yellow phlegm.  She denies fevers, headaches, dyspnea.   She also complains of painful menstrual cramps.  She is taking ibuprofen but it is not helping.  She has also taken naproxen in the past, but it did not help much.   Review of Systems Pertinent items in HPI    Objective:   Physical Exam BP 107/68  Pulse 76  Temp 98.5 F (36.9 C) (Oral)  Ht 5' 6.5" (1.689 m)  Wt 166 lb (75.297 kg)  BMI 26.39 kg/m2 General appearance: alert, cooperative and no distress Eyes: conjunctivae/corneas clear. PERRL, EOM's intact. Fundi benign. Ears: Normal canal and TM left, Right canal obstructed by wax, this was removed, TM normal Nose: clear discharge, no sinus tenderness Throat: lips, mucosa, and tongue normal; teeth and gums normal Neck: mild anterior cervical adenopathy and supple, symmetrical, trachea midline Lungs: clear to auscultation bilaterally Heart: regular rate and rhythm, S1, S2 normal, no murmur, click, rub or gallop       Assessment & Plan:

## 2012-07-09 NOTE — Telephone Encounter (Signed)
Rx called in and pt informed. Marena Witts Dawn  

## 2013-01-19 ENCOUNTER — Ambulatory Visit (INDEPENDENT_AMBULATORY_CARE_PROVIDER_SITE_OTHER): Payer: Medicaid Other | Admitting: Family Medicine

## 2013-01-19 ENCOUNTER — Encounter: Payer: Self-pay | Admitting: Family Medicine

## 2013-01-19 ENCOUNTER — Other Ambulatory Visit (HOSPITAL_COMMUNITY)
Admission: RE | Admit: 2013-01-19 | Discharge: 2013-01-19 | Disposition: A | Discharge status: Home / Self Care | Payer: Medicaid Other | Source: Ambulatory Visit | Attending: Family Medicine | Admitting: Family Medicine

## 2013-01-19 VITALS — BP 106/65 | HR 74 | Temp 98.9°F | Ht 66.5 in | Wt 148.8 lb

## 2013-01-19 DIAGNOSIS — A5901 Trichomonal vulvovaginitis: Secondary | ICD-10-CM

## 2013-01-19 DIAGNOSIS — Z113 Encounter for screening for infections with a predominantly sexual mode of transmission: Secondary | ICD-10-CM | POA: Insufficient documentation

## 2013-01-19 DIAGNOSIS — N760 Acute vaginitis: Secondary | ICD-10-CM

## 2013-01-19 LAB — POCT WET PREP (WET MOUNT)

## 2013-01-19 MED ORDER — METRONIDAZOLE 500 MG PO TABS: 1000.0000 mg | ORAL_TABLET | Freq: Once | ORAL | Status: DC

## 2013-01-20 NOTE — Progress Notes (Signed)
Patient ID: Krystal West, female   DOB: [birth year], 42 y.o.   MRN: [id number] Subjective: The patient is a 42 y.o. year old female who presents today for vaginal discharge.  Discharge the present for about one week. It is associated with some itching but no dysuria, abdominal pain, fevers, chills, nausea, or vomiting. She has had one sexual partner and does not use any form of protection. The patient thought that she had a yeast infection and treated herself with a leftover Diflucan tablet one week ago. She continues to have some symptoms.  Patient's past medical, social, and family history were reviewed and updated as appropriate. History  Substance Use Topics  . Smoking status: Never Smoker   . Smokeless tobacco: Never Used  . Alcohol Use: 0.0 oz/week    1-2 Shots of liquor per week   Objective:  Filed Vitals:   01/19/13 1559  BP: 106/65  Pulse: 74  Temp: 98.9 F (37.2 C)   Gen: No acute distress Abdomen: Nontender Patient reports that she started her period 1 day ago and would prefer not to have a pelvic exam at this time  Assessment/Plan: Wet prep showed trichomonas. No evidence of yeast. Patient treated with single dose of Flagyl in accordance with CDC recommendations. She was told return of her symptoms persist.  Of note the patient was extremely distraught over this diagnosis as she does not want to talk about with her family and she had not suspected any infidelity with her partner  Please also see individual problems in problem list for problem-specific plans.

## 2013-01-21 ENCOUNTER — Telehealth: Payer: Self-pay | Admitting: Family Medicine

## 2013-01-21 NOTE — Telephone Encounter (Signed)
Please let patient know that gonorrhea and Chlamydia were negative.

## 2013-01-21 NOTE — Telephone Encounter (Signed)
Pt notified.  Reesa Gotschall L, CMA  

## 2013-03-11 ENCOUNTER — Encounter: Payer: Medicaid Other | Admitting: Family Medicine

## 2013-03-18 ENCOUNTER — Ambulatory Visit (INDEPENDENT_AMBULATORY_CARE_PROVIDER_SITE_OTHER): Payer: Medicaid Other | Admitting: Family Medicine

## 2013-03-18 ENCOUNTER — Other Ambulatory Visit (HOSPITAL_COMMUNITY)
Admission: RE | Admit: 2013-03-18 | Discharge: 2013-03-18 | Disposition: A | Discharge status: Home / Self Care | Payer: Medicaid Other | Source: Ambulatory Visit | Attending: Family Medicine | Admitting: Family Medicine

## 2013-03-18 VITALS — BP 101/62 | HR 84 | Temp 99.5°F | Ht 66.5 in | Wt 148.0 lb

## 2013-03-18 DIAGNOSIS — N644 Mastodynia: Secondary | ICD-10-CM

## 2013-03-18 DIAGNOSIS — R109 Unspecified abdominal pain: Secondary | ICD-10-CM

## 2013-03-18 DIAGNOSIS — F319 Bipolar disorder, unspecified: Secondary | ICD-10-CM

## 2013-03-18 DIAGNOSIS — Z01419 Encounter for gynecological examination (general) (routine) without abnormal findings: Secondary | ICD-10-CM | POA: Insufficient documentation

## 2013-03-18 DIAGNOSIS — Z124 Encounter for screening for malignant neoplasm of cervix: Secondary | ICD-10-CM

## 2013-03-18 MED ORDER — MELOXICAM 7.5 MG PO TABS: 7.5000 mg | ORAL_TABLET | Freq: Every day | ORAL | Status: DC

## 2013-03-18 NOTE — Patient Instructions (Addendum)
I did not feel any abnormality on your breasts but since you have positive family history of breat cancer screening is appropriate.  For your abdominal pain with menses it is appropriate to take Mobic once a day during that time if this does not resolve you may consider evaluation with Gynecology for further evaluation.

## 2013-03-18 NOTE — Progress Notes (Signed)
Family Medicine Office Visit Note   Subjective:   Patient ID: Krystal West, female  DOB: 1970-12-08, 42 y.o.. MRN: [id number]   This is my first time seen Krystal West. She comes today for her annual examination. Her complaints today are:  #1. Bilateral but mostly on the right breast pain: pt describes it as "shutting pain like electricity" inside her breasts. Denies chest pain or palpitations with episode. Denies swelling,redness or nipple discharge. Pt reports this happens more frequent with her menses but also happen with no predictable pattern/ frequency. She reportshaving this symptoms for the past 2 months.   #2 Chronic pelvic pain related to menses: cramp like pelvic pain worse at the end of her periods. She takes ibuprofen and tylenol with no significant relieve. Pain not irradiated, no associated to nausea or vomiting. Pt also comes for pap smear.  #3 Bipolar disorder. She f/u with Dr. Jon Gills. She is currently taking only Klonopin which he prescribes. She is not longer on Seroquel or Fluoxetine. Meds have been updated on her EMR today.    Review of Systems:  Pt denies SOB, headaches, dizziness, numbness or weakness. No changes on urinary or BM habits. Weight stable since her last appointment in June/2014 but pt has lost 42 lb in 1.5 years.    Objective:   Physical Exam: Gen:  NAD HEENT: Moist mucous membranes  CV: Regular rate and rhythm, no murmurs rubs or gallops PULM: Clear to auscultation bilaterally. No wheezes/rales/rhonchi ABD: Soft, no distended, no tender,  no rebound or guarding. Normal bowel sounds EXT: No edema Neuro: Alert and oriented x3. No focalization Vulva and perianal area: Normal  Speculum: Vagina and cervix of normal appearance, no friability, no discharge. Bimanual exam: Uterus anteverted, no adnexal masses. No cervical motion tenderness.  Assessment & Plan:

## 2013-03-28 DIAGNOSIS — N644 Mastodynia: Secondary | ICD-10-CM | POA: Insufficient documentation

## 2013-03-28 NOTE — Assessment & Plan Note (Signed)
On Klonopin only prescribed by Psychiatrist.

## 2013-03-28 NOTE — Assessment & Plan Note (Signed)
Apparently improved after U/S done in 2010 but pt reports has never completely resolved. It is related to her menses. Pelvic exam today WNL. Most likely endometriosis.  P/ Discussed with pt options for diagnostic and treatment. She declines any invasive procedure such as laparoscopic evaluation Mobic ordered daily with her menses Discussed signs of worsening condition that should prompt re-evaluation.

## 2013-03-28 NOTE — Assessment & Plan Note (Signed)
Positive family Hx of Grandmother died at age of 45 with breast cancer. Negative physical exam. P/ Screening mammography.

## 2013-05-11 ENCOUNTER — Ambulatory Visit (INDEPENDENT_AMBULATORY_CARE_PROVIDER_SITE_OTHER): Payer: Medicaid Other | Admitting: Family Medicine

## 2013-05-11 ENCOUNTER — Encounter: Payer: Self-pay | Admitting: Family Medicine

## 2013-05-11 VITALS — BP 111/70 | HR 73 | Temp 98.4°F | Ht 65.0 in | Wt 152.7 lb

## 2013-05-11 DIAGNOSIS — R109 Unspecified abdominal pain: Secondary | ICD-10-CM

## 2013-05-11 LAB — COMPLETE METABOLIC PANEL WITHOUT GFR
ALT: 12 U/L (ref 0–35)
AST: 12 U/L (ref 0–37)
Albumin: 4 g/dL (ref 3.5–5.2)
Alkaline Phosphatase: 55 U/L (ref 39–117)
BUN: 11 mg/dL (ref 6–23)
CO2: 30 meq/L (ref 19–32)
Calcium: 9.9 mg/dL (ref 8.4–10.5)
Chloride: 103 meq/L (ref 96–112)
Creat: 0.71 mg/dL (ref 0.50–1.10)
GFR, Est African American: >89 mL/min
GFR, Est Non African American: >89 mL/min
Glucose, Bld: 66 mg/dL — ABNORMAL LOW (ref 70–99)
Potassium: 4.3 meq/L (ref 3.5–5.3)
Sodium: 140 meq/L (ref 135–145)
Total Bilirubin: 0.3 mg/dL (ref 0.3–1.2)
Total Protein: 6.5 g/dL (ref 6.0–8.3)

## 2013-05-11 LAB — LIPASE: Lipase: 11 U/L (ref 0–75)

## 2013-05-11 MED ORDER — CYCLOBENZAPRINE HCL 10 MG PO TABS: 10.0000 mg | ORAL_TABLET | Freq: Three times a day (TID) | ORAL | Status: DC | PRN

## 2013-05-11 MED ORDER — POLYETHYLENE GLYCOL 3350 17 GM/SCOOP PO POWD: 17.0000 g | Freq: Every day | ORAL | Status: DC | PRN

## 2013-05-11 NOTE — Assessment & Plan Note (Addendum)
Patient having both back and abdominal discomfort. Unclear etiology.  Abdominal exam = normal.  Patient has had intermittent constipation  (BM's ~ 3x/week) which may be contributing. She has had no vaginal discharge to suggest STD/PID, No abdominal findings to suggest cholecystitis/cholelithiasis/Liver pathology/etc.  Will obtain CMP and lipase today.  Patient reassured given normal exam. Will treat constipation with miralax.  Will treat reported back discomfort with flexeril.  Patient instructed to follow up in 2 weeks.

## 2013-05-11 NOTE — Patient Instructions (Signed)
It was nice to see you today.  The etiology of your pain is unclear.  It appears to be more musculoskeletal in nature than from your GI tract.  I'm am treating your back pain with Flexeril.  I am also giving you something for constipation (Miralax).  Follow up in ~ 2 weeks or sooner if needed

## 2013-05-11 NOTE — Progress Notes (Signed)
Subjective:     Patient ID: Krystal West, female   DOB: 01-26-1971, 42 y.o.   MRN: [id number]  HPI 42 year old female presents for same day visit with complaints of abdominal pain.  1) Abdominal pain  Location: Lower back radiating to epigastric and periumbilical regions Onset: Began 2 weeks ago  Severity: Moderate to severe in nature (5-10/10) Quality: Described as cramping in nature Duration: Pain has been intermittent for the past 2 weeks Better with: Some improvement initially with Ibuprofen and tylenol Worse with: Not eating   ROS: Nausea/Vomiting: No Diarrhea: Patient has had some diarrhea following bouts of constipation.  Melena/BRBPR: No Hematemesis: No Anorexia: No Fever/Chills: No Dysuria: No NSAIDs/ASA: Yes No vaginal discharge to suggest STD   Review of Systems Per HPI    Objective:   Physical Exam Exam: General: well appearing, NAD. HEENT: No scleral icterus. Cardiovascular: RRR. No murmurs, rubs, or gallops. Respiratory: CTAB. No rales, rhonchi, or wheeze. Abdomen: soft, nontender, nondistended.  No guarding.  Negative murphy's sign.  No palpable organomegaly. Negative CVA tenderness.  Back: Normal ROM.       Assessment:     See Problem List    Plan:

## 2013-05-12 ENCOUNTER — Encounter: Payer: Self-pay | Admitting: Family Medicine

## 2013-12-01 ENCOUNTER — Other Ambulatory Visit: Payer: Self-pay | Admitting: *Deleted

## 2013-12-02 MED ORDER — MELOXICAM 7.5 MG PO TABS: 7.5000 mg | ORAL_TABLET | Freq: Every day | ORAL | Status: DC

## 2013-12-22 ENCOUNTER — Ambulatory Visit (INDEPENDENT_AMBULATORY_CARE_PROVIDER_SITE_OTHER): Payer: Medicaid Other | Admitting: Family Medicine

## 2013-12-22 VITALS — BP 101/68 | HR 73 | Temp 98.6°F | Resp 18 | Wt 181.0 lb

## 2013-12-22 DIAGNOSIS — J069 Acute upper respiratory infection, unspecified: Secondary | ICD-10-CM

## 2013-12-22 MED ORDER — AMOXICILLIN-POT CLAVULANATE 875-125 MG PO TABS: 1.0000 | ORAL_TABLET | Freq: Two times a day (BID) | ORAL | Status: DC

## 2013-12-22 MED ORDER — HYDROCORTISONE ACETATE 1 % EX OINT: TOPICAL_OINTMENT | CUTANEOUS | Status: DC

## 2013-12-22 MED ORDER — IPRATROPIUM BROMIDE 0.06 % NA SOLN: 2.0000 | Freq: Four times a day (QID) | NASAL | Status: DC | PRN

## 2013-12-22 NOTE — Addendum Note (Signed)
Addended by: Coral Spikes on: 12/22/2013 11:55 AM   Modules accepted: Level of Service

## 2013-12-22 NOTE — Assessment & Plan Note (Signed)
Given duration of symptoms and worsening, will treat empirically with Augmentin. Patient to followup with PCP.

## 2013-12-22 NOTE — Patient Instructions (Signed)
It was nice to see you today.  Please take the medications as prescribed.   If you worsen or fail to improve please see your PCP.

## 2013-12-22 NOTE — Progress Notes (Addendum)
   Subjective:    Patient ID: Krystal West, female    DOB: 1970-10-23, 43 y.o.   MRN: [id number]  HPI 43 year old female presents for same day visit with complaints of cough, congestion, and fatigue.  Patient reports she has not been feeling well for the past 2 weeks.  She has been experiencing productive cough, purulent nasal discharge and significant fatigue over this period of time. She has been taking several over-the-counter medications to relieve her symptoms (Mucinex, Alka-Seltzer cold, etc.) But these have failed.  She denies any associated fevers, chills, nausea, vomiting, shortness of breath.  She does report that her son is also sick with a similar illness.  Soc Hx - Non smoker Review of Systems Per HPI    Objective:   Physical Exam Filed Vitals:   12/22/13 0930  BP: 101/68  Pulse: 73  Temp: 98.6 F (37 C)  Resp: 18   Exam: General: well developed, well-nourished. Several paroxysm of cough noted. HEENT: NCAT.  Normal TMs bilaterally.  Oropharynx- prominent tonsils without exudate.  Nose- erythematous boggy nasal turbinates noted. Neck: No adenopathy.  Cardiovascular: RRR. No murmurs, rubs, or gallops. Respiratory: CTAB. No rales, rhonchi, or wheeze. Abdomen: soft, nontender, nondistended.     Assessment & Plan:  See Problem List

## 2014-01-10 ENCOUNTER — Emergency Department (HOSPITAL_COMMUNITY)
Admission: EM | Admit: 2014-01-10 | Discharge: 2014-01-10 | Disposition: A | Discharge status: Home / Self Care | Payer: Medicaid Other | Source: Home / Self Care | Attending: Emergency Medicine | Admitting: Emergency Medicine

## 2014-01-10 ENCOUNTER — Ambulatory Visit: Payer: Medicaid Other

## 2014-01-10 ENCOUNTER — Encounter (HOSPITAL_COMMUNITY): Payer: Self-pay | Admitting: Emergency Medicine

## 2014-01-10 ENCOUNTER — Other Ambulatory Visit (HOSPITAL_COMMUNITY)
Admission: RE | Admit: 2014-01-10 | Discharge: 2014-01-10 | Disposition: A | Discharge status: Home / Self Care | Payer: Medicaid Other | Source: Ambulatory Visit | Attending: Emergency Medicine | Admitting: Emergency Medicine

## 2014-01-10 DIAGNOSIS — N760 Acute vaginitis: Secondary | ICD-10-CM | POA: Insufficient documentation

## 2014-01-10 DIAGNOSIS — N739 Female pelvic inflammatory disease, unspecified: Secondary | ICD-10-CM

## 2014-01-10 DIAGNOSIS — Z113 Encounter for screening for infections with a predominantly sexual mode of transmission: Secondary | ICD-10-CM | POA: Insufficient documentation

## 2014-01-10 LAB — POCT URINALYSIS DIP (DEVICE)
Bilirubin Urine: NEGATIVE
Glucose, UA: NEGATIVE mg/dL
HGB URINE DIPSTICK: NEGATIVE
Ketones, ur: NEGATIVE mg/dL
LEUKOCYTES UA: NEGATIVE
NITRITE: NEGATIVE
PH: 6.5 (ref 5.0–8.0)
PROTEIN: NEGATIVE mg/dL
Specific Gravity, Urine: 1.02 (ref 1.005–1.030)
Urobilinogen, UA: 0.2 mg/dL (ref 0.0–1.0)

## 2014-01-10 LAB — POCT PREGNANCY, URINE: Preg Test, Ur: NEGATIVE

## 2014-01-10 MED ORDER — HYDROCODONE-ACETAMINOPHEN 5-325 MG PO TABS: ORAL_TABLET | ORAL | Status: DC

## 2014-01-10 MED ORDER — CEFTRIAXONE SODIUM 250 MG IJ SOLR
INTRAMUSCULAR | Status: AC
  Filled 2014-01-10: qty 250

## 2014-01-10 MED ORDER — CEFTRIAXONE SODIUM 250 MG IJ SOLR
250.0000 mg | Freq: Once | INTRAMUSCULAR | Status: AC
  Administered 2014-01-10: 250 mg via INTRAMUSCULAR

## 2014-01-10 MED ORDER — AZITHROMYCIN 250 MG PO TABS
ORAL_TABLET | ORAL | Status: AC
  Filled 2014-01-10: qty 4

## 2014-01-10 MED ORDER — HYDROCORTISONE 2.5 % RE CREA: TOPICAL_CREAM | RECTAL | Status: DC

## 2014-01-10 MED ORDER — LIDOCAINE HCL (PF) 1 % IJ SOLN
INTRAMUSCULAR | Status: AC
  Filled 2014-01-10: qty 5

## 2014-01-10 MED ORDER — DOXYCYCLINE HYCLATE 100 MG PO TABS: 100.0000 mg | ORAL_TABLET | Freq: Two times a day (BID) | ORAL | Status: DC

## 2014-01-10 MED ORDER — AZITHROMYCIN 250 MG PO TABS
1000.0000 mg | ORAL_TABLET | Freq: Once | ORAL | Status: AC
  Administered 2014-01-10: 1000 mg via ORAL

## 2014-01-10 MED ORDER — METRONIDAZOLE 500 MG PO TABS: 500.0000 mg | ORAL_TABLET | Freq: Two times a day (BID) | ORAL | Status: DC

## 2014-01-10 NOTE — ED Notes (Signed)
Pt c/o dysuria onset 3 days Reports it hurts when she wipes; also c/o pain rectal area when she wipes Believes it maybe anal fissures; noticed blood when she wipes Also having mild abd pain and feeling nauseas Denies f/v/d Alert w/no signs of acute distress.

## 2014-01-10 NOTE — Discharge Instructions (Signed)
Pelvic Inflammatory Disease Pelvic inflammatory disease (PID) refers to an infection in some or all of the female organs. The infection can be in the uterus, ovaries, fallopian tubes, or the surrounding tissues in the pelvis. PID can cause abdominal or pelvic pain that comes on suddenly (acute pelvic pain). PID is a serious infection because it can lead to lasting (chronic) pelvic pain or the inability to have children (infertile).  CAUSES  The infection is often caused by the normal bacteria found in the vaginal tissues. PID may also be caused by an infection that is spread during sexual contact. PID can also occur following:   The birth of a baby.   A miscarriage.   An abortion.   Major pelvic surgery.   The use of an intrauterine device (IUD).   A sexual assault.  RISK FACTORS Certain factors can put a person at higher risk for PID, such as:  Being younger than 25 years.  Being sexually active at [place] age.  Usingnonbarrier contraception.  Havingmultiple sexual partners.  Having sex with someone who has symptoms of a genital infection.  Using oral contraception. Other times, certain behaviors can increase the possibility of getting PID, such as:  Having sex during your period.  Using a vaginal douche.  Having an intrauterine device (IUD) in place. SYMPTOMS   Abdominal or pelvic pain.   Fever.   Chills.   Abnormal vaginal discharge.  Abnormal uterine bleeding.   Unusual pain shortly after finishing your period. DIAGNOSIS  Your caregiver will choose some of the following methods to make a diagnosis, such as:   Performinga physical exam and history. A pelvic exam typically reveals a very tender uterus and surrounding pelvis.   Ordering laboratory tests including a pregnancy test, blood tests, and urine test.  Orderingcultures of the vagina and cervix to check for a sexually transmitted infection (STI).  Performing an ultrasound.    Performing a laparoscopic procedure to look inside the pelvis.  TREATMENT   Antibiotic medicines may be prescribed and taken by mouth.   Sexual partners may be treated when the infection is caused by a sexually transmitted disease (STD).   Hospitalization may be needed to give antibiotics intravenously.  Surgery may be needed, but this is rare. It may take weeks until you are completely well. If you are diagnosed with PID, you should also be checked for human immunodeficiency virus (HIV). HOME CARE INSTRUCTIONS   If given, take your antibiotics as directed. Finish the medicine even if you start to feel better.   Only take over-the-counter or prescription medicines for pain, discomfort, or fever as directed by your caregiver.   Do not have sexual intercourse until treatment is completed or as directed by your caregiver. If PID is confirmed, your recent sexual partner(s) will need treatment.   Keep your follow-up appointments. SEEK MEDICAL CARE IF:   You have increased or abnormal vaginal discharge.   You need prescription medicine for your pain.   You vomit.   You cannot take your medicines.   Your partner has an STD.  SEEK IMMEDIATE MEDICAL CARE IF:   You have a fever.   You have increased abdominal or pelvic pain.   You have chills.   You have pain when you urinate.   You are not better after 72 hours following treatment.  MAKE SURE YOU:   Understand these instructions.  Will watch your condition.  Will get help right away if you are not doing well or get worse.  Document Released: 07/21/2005 Document Revised: 11/15/2012 Document Reviewed: 07/17/2011 [facility] Patient Information 2014 [place], [place].

## 2014-01-10 NOTE — ED Notes (Addendum)
Affirm: Candida and Trich neg., Gardnerella pos.  Pt. adequately treated with Flagyl. Hanley Seamen Kendelle Schweers 01/10/2014 GC/Chlamydia neg. 01/12/2014

## 2014-01-10 NOTE — ED Provider Notes (Signed)
Chief Complaint   Chief Complaint  Patient presents with  . Urinary Tract Infection  . Abdominal Pain  . Rectal Pain    History of Present Illness   Krystal West is a 43 year old female who has had a four-day history of dysuria, urinary frequency, perianal pain, pelvic pain, nausea, and vaginal itching. She denies any vomiting or discharge. She's had no fever or chills. This came on after intercourse with her ex-husband. They did use a condom. She's had some diarrhea, and a small amount of blood in the toilet paper after wiping.  Review of Systems   Other than as noted above, the patient denies any of the following symptoms: Systemic:  No fever or chills GI:  No abdominal pain, nausea, vomiting, diarrhea, constipation, melena or hematochezia. GU:  No dysuria, frequency, urgency, hematuria, vaginal discharge, itching, or abnormal vaginal bleeding.  [place]   Past medical history, family history, social history, meds, and allergies were reviewed.    Physical Examination    Vital signs:  BP 121/78  Pulse 57  Temp(Src) 98.2 F (36.8 C) (Oral)  Resp 16  SpO2 100%  LMP 11/24/2013 General:  Alert, oriented and in no distress. Lungs:  Breath sounds clear and equal bilaterally.  No wheezes, rales or rhonchi. Heart:  Regular rhythm.  No gallops or murmers. Abdomen:  Soft, flat and non-distended.  No organomegaly or mass.  No tenderness, guarding or rebound.  Bowel sounds normally active. Pelvic exam:  Normal external genitalia, vaginal and cervical mucosa were normal. A small white discharge. She has mild pain on cervical motion. Mild uterine tenderness to palpation, and mild adnexal tenderness to palpation.  DNA probes for gonorrhea, Chlamydia, Trichomonas, Gardnerella, Candida were obtained. Rectal: External inspection of the rectum was unremarkable. Skin:  Clear, warm and dry.  Chaperoned by [place] Shannon, CMA who was present throughout the pelvic exam.   Labs   Results for  orders placed during the hospital encounter of 01/10/14  POCT URINALYSIS DIP (DEVICE)      Result Value Ref Range   Glucose, UA NEGATIVE  NEGATIVE mg/dL   Bilirubin Urine NEGATIVE  NEGATIVE   Ketones, ur NEGATIVE  NEGATIVE mg/dL   Specific Gravity, Urine 1.020  1.005 - 1.030   Hgb urine dipstick NEGATIVE  NEGATIVE   pH 6.5  5.0 - 8.0   Protein, ur NEGATIVE  NEGATIVE mg/dL   Urobilinogen, UA 0.2  0.0 - 1.0 mg/dL   Nitrite NEGATIVE  NEGATIVE   Leukocytes, UA NEGATIVE  NEGATIVE  POCT PREGNANCY, URINE      Result Value Ref Range   Preg Test, Ur NEGATIVE  NEGATIVE     Course in Urgent Care Center   Given Rocephin 250 mg IM and azithromycin 1000 mg by mouth.  Assessment   The encounter diagnosis was Pelvic inflammatory disease.       Plan    1.  Meds:  The following meds were prescribed:   Discharge Medication List as of 01/10/2014 11:58 AM    START taking these medications   Details  doxycycline (VIBRA-TABS) 100 MG tablet Take 1 tablet (100 mg total) by mouth 2 (two) times daily., Starting 01/10/2014, Until Discontinued, Normal    HYDROcodone-acetaminophen (NORCO/VICODIN) 5-325 MG per tablet 1 to 2 tabs every 4 to 6 hours as needed for pain., Print    hydrocortisone (ANUSOL-HC) 2.5 % rectal cream Apply rectally 2 times daily, Normal    metroNIDAZOLE (FLAGYL) 500 MG tablet Take 1 tablet (500 mg total) by  mouth 2 (two) times daily., Starting 01/10/2014, Until Discontinued, Normal        2.  Patient Education/Counseling:  The patient was given appropriate handouts, self care instructions, and instructed in symptomatic relief.    3.  Follow up:  The patient was told to follow up here if no better in 3 to 4 days, or sooner if becoming worse in any way, and given some red flag symptoms such as worsening pain, fever, persistent vomiting, or heavy vaginal bleeding which would prompt immediate return.       Harden Mo, MD 01/10/14 2212

## 2014-01-10 NOTE — Progress Notes (Signed)
Quick Note:  Results are abnormal as noted, but have been adequately treated. No further action necessary. ______ 

## 2014-01-16 ENCOUNTER — Telehealth: Payer: Self-pay | Admitting: Family Medicine

## 2014-01-16 NOTE — Telephone Encounter (Signed)
Was in ER on June 9 for yeast infection. She was given meds and has been taking them but is not better "My koochie is on fire" Please advise

## 2014-01-16 NOTE — Telephone Encounter (Signed)
Can she be prescribed medication again or will she need to come in to her further testing?

## 2014-04-21 ENCOUNTER — Encounter: Payer: Self-pay | Admitting: Family Medicine

## 2014-04-21 ENCOUNTER — Ambulatory Visit (INDEPENDENT_AMBULATORY_CARE_PROVIDER_SITE_OTHER): Payer: Medicaid Other | Admitting: Family Medicine

## 2014-04-21 VITALS — BP 110/70 | HR 60 | Temp 98.5°F | Ht 65.0 in | Wt 185.0 lb

## 2014-04-21 DIAGNOSIS — R109 Unspecified abdominal pain: Secondary | ICD-10-CM

## 2014-04-21 DIAGNOSIS — R1084 Generalized abdominal pain: Secondary | ICD-10-CM

## 2014-04-21 LAB — POCT URINE PREGNANCY: Preg Test, Ur: NEGATIVE

## 2014-04-21 MED ORDER — TRAMADOL HCL 50 MG PO TABS: 50.0000 mg | ORAL_TABLET | Freq: Three times a day (TID) | ORAL | Status: DC | PRN

## 2014-04-21 NOTE — Patient Instructions (Addendum)
Ms Loughmiller it was great to meet you today!  I am sorry that you are not feeling well.  Lets get an ultrasound to look at your uterus and ovaries Then we can refer you to [facility] for definitive treatment  Please return to clinic if symptoms do not improve or worsen Feel better soon Bernadene Bell, MD

## 2014-04-21 NOTE — Assessment & Plan Note (Signed)
Worsened; Will repeat US again Had nml in 2010 Suspect endometriosis  Pt now interested in potential for surgical management  Declines trial of mirena again (which is not really even best option in woman >40 anyway) Pt in agreement for [place] followed by [facility] referral  Potential for ablation vs hysterectomy

## 2014-04-21 NOTE — Progress Notes (Signed)
Patient ID: Jacarra Bobak, female   DOB: 1970/10/27, 43 y.o.   MRN: [id number]   [facility] Bernadene Bell, MD Phone: [phone]  Subjective:  Ms Rottinghaus is a 43 y.o F who presents for SDA for menstrual cramping without a period   # Abdominal cramping -Abdominal Pain: Patient complains of abdominal pain. The pain is described as cramping, and is 10/10 in intensity. Pain is located in the suprapubic with radiation to back and rest of the abdomen. Has been going on for years. Symptoms have been unchanged since. Aggravating factors: none.  Alleviating factors: none. Associated symptoms: headache and myalgias. The patient denies constipation, diarrhea and fever. Has taken mobic without relief.  -LMP 04/12/2014- nml period- lasting 10 days, heavy thoughout- goes through 3-4 pads and tampons a day (super + absorbency)  All relevant systems were reviewed and were negative unless otherwise noted in the HPI  Past Medical History Reviewed problem list.  Medications- reviewed and updated Current Outpatient Prescriptions  Medication Sig Dispense Refill  . acetaminophen-codeine (TYLENOL #3) 300-30 MG per tablet Take 1 tablet by mouth every 4 (four) hours as needed for pain (or cough).  30 tablet  0  . amoxicillin-clavulanate (AUGMENTIN) 875-125 MG per tablet Take 1 tablet by mouth 2 (two) times daily.  20 tablet  0  . clonazePAM (KLONOPIN) 1 MG tablet Take 1 mg by mouth 2 (two) times daily.      . cyclobenzaprine (FLEXERIL) 10 MG tablet Take 1 tablet (10 mg total) by mouth 3 (three) times daily as needed (for back pain.).  30 tablet  1  . doxycycline (VIBRA-TABS) 100 MG tablet Take 1 tablet (100 mg total) by mouth 2 (two) times daily.  28 tablet  0  . fluticasone (FLONASE) 50 MCG/ACT nasal spray Place 2 sprays into the nose daily.  16 g  6  . HYDROcodone-acetaminophen (NORCO/VICODIN) 5-325 MG per tablet 1 to 2 tabs every 4 to 6 hours as needed for pain.  20 tablet  0  .  hydrocortisone (ANUSOL-HC) 2.5 % rectal cream Apply rectally 2 times daily  30 g  2  . Hydrocortisone Acetate (TUCKS ANTI-ITCH) 1 % OINT Apply topical as needed.  30 g  0  . ipratropium (ATROVENT) 0.06 % nasal spray Place 2 sprays into both nostrils 4 (four) times daily as needed for rhinitis.  15 mL  1  . lithium carbonate (ESKALITH) 450 MG CR tablet Take by mouth 2 (two) times daily.      . meloxicam (MOBIC) 7.5 MG tablet Take 1 tablet (7.5 mg total) by mouth daily.  30 tablet  3  . metroNIDAZOLE (FLAGYL) 500 MG tablet Take 1 tablet (500 mg total) by mouth 2 (two) times daily.  28 tablet  0  . norgestimate-ethinyl estradiol (SPRINTEC 28) 0.25-35 MG-MCG per tablet Take 1 tablet by mouth daily.  30 tablet  11  . polyethylene glycol powder (GLYCOLAX/MIRALAX) powder Take 17 g by mouth daily as needed (Constipation.).  3350 g  1  . zolpidem (AMBIEN) 10 MG tablet Take 10 mg by mouth at bedtime as needed for sleep.       No current facility-administered medications for this visit.   Chief complaint-noted No additions to family history Social history- patient is a never smoker  Objective: BP 110/70  Pulse 60  Temp(Src) 98.5 F (36.9 C) (Oral)  Ht 5\' 5"  (1.651 m)  Wt 185 lb (83.915 kg)  BMI 30.79 kg/m2  LMP 04/12/2014 Gen: NAD,  alert, cooperative with exam Abd: SNTND, BS present, no guarding or organomegaly Ext: No edema, warm, normal tone, moves UE/LE spontaneously Psych: unusual affect Skin: no rashes no lesions  Assessment/Plan: See problem based a/p

## 2014-04-22 ENCOUNTER — Other Ambulatory Visit: Payer: Self-pay | Admitting: Family Medicine

## 2014-04-22 DIAGNOSIS — R1084 Generalized abdominal pain: Secondary | ICD-10-CM

## 2014-04-24 ENCOUNTER — Ambulatory Visit (HOSPITAL_COMMUNITY): Admission: RE | Admit: 2014-04-24 | Payer: Medicaid Other | Source: Ambulatory Visit

## 2014-04-27 ENCOUNTER — Ambulatory Visit (HOSPITAL_COMMUNITY)
Admission: RE | Admit: 2014-04-27 | Discharge: 2014-04-27 | Disposition: A | Discharge status: Home / Self Care | Payer: Medicaid Other | Source: Ambulatory Visit | Attending: Family Medicine | Admitting: Family Medicine

## 2014-04-27 DIAGNOSIS — R109 Unspecified abdominal pain: Secondary | ICD-10-CM | POA: Diagnosis not present

## 2014-04-27 DIAGNOSIS — D251 Intramural leiomyoma of uterus: Secondary | ICD-10-CM | POA: Diagnosis not present

## 2014-04-27 DIAGNOSIS — N920 Excessive and frequent menstruation with regular cycle: Secondary | ICD-10-CM | POA: Diagnosis not present

## 2014-04-27 DIAGNOSIS — R1084 Generalized abdominal pain: Secondary | ICD-10-CM

## 2014-04-28 ENCOUNTER — Telehealth: Payer: Self-pay | Admitting: Family Medicine

## 2014-04-28 DIAGNOSIS — N8500 Endometrial hyperplasia, unspecified: Secondary | ICD-10-CM

## 2014-04-28 NOTE — Telephone Encounter (Signed)
Pt informed.  Referral placed in [facility] workqueue. Elvie Palomo, Salome Spotted

## 2014-04-28 NOTE — Telephone Encounter (Signed)
Could we let pt know: Endometrial hyperplasia (could be within realm of normal) Will refer to [facility], MD

## 2014-05-08 ENCOUNTER — Telehealth: Payer: Self-pay | Admitting: *Deleted

## 2014-05-08 NOTE — Telephone Encounter (Signed)
Pt seen by Dr. Skeet Simmer for this and if appropriate will have her refill.  I have never seen this pt.  Thanks [employer]. Awanda Mink, DO of [facility] 05/08/2014, 9:19 PM

## 2014-05-08 NOTE — Telephone Encounter (Signed)
Pt seen recently for pelvic/uterine pain, is waiting on referral for a procedure to have her wall stripped, pt was given pain meds here in office but is out, wants to know if MD will refill until she has the procedure done. Pt goes to [facility]/[address]. Krystal West

## 2014-05-09 MED ORDER — ACETAMINOPHEN 500 MG PO TABS: 500.0000 mg | ORAL_TABLET | Freq: Four times a day (QID) | ORAL | Status: DC | PRN

## 2014-05-09 NOTE — Telephone Encounter (Signed)
Extra strength tylenol appropriate [facility], MD

## 2014-05-09 NOTE — Telephone Encounter (Signed)
LMOVM for pt to return call .Krystal West  

## 2014-06-21 ENCOUNTER — Other Ambulatory Visit (HOSPITAL_COMMUNITY)
Admission: RE | Admit: 2014-06-21 | Discharge: 2014-06-21 | Disposition: A | Discharge status: Home / Self Care | Payer: Medicaid Other | Source: Ambulatory Visit | Attending: Obstetrics & Gynecology | Admitting: Obstetrics & Gynecology

## 2014-06-21 ENCOUNTER — Encounter: Payer: Self-pay | Admitting: Obstetrics & Gynecology

## 2014-06-21 ENCOUNTER — Ambulatory Visit (INDEPENDENT_AMBULATORY_CARE_PROVIDER_SITE_OTHER): Payer: Medicaid Other | Admitting: Obstetrics & Gynecology

## 2014-06-21 VITALS — BP 95/73 | HR 74 | Temp 98.7°F | Ht 65.0 in | Wt 184.0 lb

## 2014-06-21 DIAGNOSIS — N921 Excessive and frequent menstruation with irregular cycle: Secondary | ICD-10-CM

## 2014-06-21 DIAGNOSIS — Z01812 Encounter for preprocedural laboratory examination: Secondary | ICD-10-CM

## 2014-06-21 DIAGNOSIS — Z1151 Encounter for screening for human papillomavirus (HPV): Secondary | ICD-10-CM | POA: Diagnosis present

## 2014-06-21 DIAGNOSIS — Z01419 Encounter for gynecological examination (general) (routine) without abnormal findings: Secondary | ICD-10-CM | POA: Insufficient documentation

## 2014-06-21 LAB — POCT PREGNANCY, URINE: Preg Test, Ur: NEGATIVE

## 2014-06-21 MED ORDER — NAPROXEN 500 MG PO TABS: 500.0000 mg | ORAL_TABLET | Freq: Two times a day (BID) | ORAL | Status: DC

## 2014-06-21 MED ORDER — NORGESTIMATE-ETH ESTRADIOL 0.25-35 MG-MCG PO TABS: 1.0000 | ORAL_TABLET | Freq: Every day | ORAL | Status: DC

## 2014-06-21 NOTE — Patient Instructions (Signed)
Uterine Fibroid A uterine fibroid is a growth (tumor) that occurs in your uterus. This type of tumor is not cancerous and does not spread out of the uterus. You can have one or many fibroids. Fibroids can vary in size, weight, and where they grow in the uterus. Some can become quite large. Most fibroids do not require medical treatment, but some can cause pain or heavy bleeding during and between periods. CAUSES  A fibroid is the result of a single uterine cell that keeps growing (unregulated), which is different than most cells in the human body. Most cells have a control mechanism that keeps them from reproducing without control.  SIGNS AND SYMPTOMS   Bleeding.  Pelvic pain and pressure.  Bladder problems due to the size of the fibroid.  Infertility and miscarriages depending on the size and location of the fibroid. DIAGNOSIS  Uterine fibroids are diagnosed through a physical exam. Your health care provider may feel the lumpy tumors during a pelvic exam. Ultrasonography may be done to get information regarding size, location, and number of tumors.  TREATMENT   Your health care provider may recommend watchful waiting. This involves getting the fibroid checked by your health care provider to see if it grows or shrinks.   Hormone treatment or an intrauterine device (IUD) may be prescribed.   Surgery may be needed to remove the fibroids (myomectomy) or the uterus (hysterectomy). This depends on your situation. When fibroids interfere with fertility and a woman wants to become pregnant, a health care provider may recommend having the fibroids removed.  [place] care depends on how you were treated. In general:   Keep all follow-up appointments with your health care provider.   Only take over-the-counter or prescription medicines as directed by your health care provider. If you were prescribed a hormone treatment, take the hormone medicines exactly as directed. Do not  take aspirin. It can cause bleeding.   Talk to your health care provider about taking iron pills.  If your periods are troublesome but not so heavy, lie down with your feet raised slightly above your heart. Place cold packs on your lower abdomen.   If your periods are heavy, write down the number of pads or tampons you use per month. Bring this information to your health care provider.   Include green vegetables in your diet.  SEEK IMMEDIATE MEDICAL CARE IF:  You have pelvic pain or cramps not controlled with medicines.   You have a sudden increase in pelvic pain.   You have an increase in bleeding between and during periods.   You have excessive periods and soak tampons or pads in a half hour or less.  You feel lightheaded or have fainting episodes. Document Released: 07/18/2000 Document Revised: 05/11/2013 Document Reviewed: 02/17/2013 [facility] Patient Information 2015 [place], [place]. This information is not intended to replace advice given to you by your health care provider. Make sure you discuss any questions you have with your health care provider. Endometrial Biopsy Endometrial biopsy is a procedure in which a tissue sample is taken from inside the uterus. The tissue sample is then looked at under a microscope to see if the tissue is normal or abnormal. The endometrium is the lining of the uterus. This procedure helps determine where you are in your menstrual cycle and how hormone levels are affecting the lining of the uterus. This procedure may also be used to evaluate uterine bleeding or to diagnose endometrial cancer, tuberculosis, polyps, or inflammatory  conditions.  LET [facility] CARE PROVIDER KNOW ABOUT:  Any allergies you have.  All medicines you are taking, including vitamins, herbs, eye drops, creams, and over-the-counter medicines.  Previous problems you or members of your family have had with the use of anesthetics.  Any blood disorders you  have.  Previous surgeries you have had.  Medical conditions you have.  Possibility of pregnancy. RISKS AND COMPLICATIONS Generally, this is a safe procedure. However, as with any procedure, complications can occur. Possible complications include:  Bleeding.  Pelvic infection.  Puncture of the uterine wall with the biopsy device (rare). BEFORE THE PROCEDURE   Keep a record of your menstrual cycles as directed by your health care provider. You may need to schedule your procedure for a specific time in your cycle.  You may want to bring a sanitary pad to wear home after the procedure.  Arrange for someone to drive you home after the procedure if you will be given a medicine to help you relax (sedative). PROCEDURE   You may be given a sedative to relax you.  You will lie on an exam table with your feet and legs supported as in a pelvic exam.  Your health care provider will insert an instrument (speculum) into your vagina to see your cervix.  Your cervix will be cleansed with an antiseptic solution. A medicine (local anesthetic) will be used to numb the cervix.  A forceps instrument (tenaculum) will be used to hold your cervix steady for the biopsy.  A thin, rodlike instrument (uterine sound) will be inserted through your cervix to determine the length of your uterus and the location where the biopsy sample will be removed.  A thin, flexible tube (catheter) will be inserted through your cervix and into the uterus. The catheter is used to collect the biopsy sample from your endometrial tissue.  The catheter and speculum will then be removed, and the tissue sample will be sent to a lab for examination. AFTER THE PROCEDURE  You will rest in a recovery area until you are ready to go home.  You may have mild cramping and a small amount of vaginal bleeding for a few days after the procedure. This is normal.  Make sure you find out how to get your test results. Document Released:  11/21/2004 Document Revised: 03/23/2013 Document Reviewed: 01/05/2013 [facility] Patient Information 2015 [place], [place]. This information is not intended to replace advice given to you by your health care provider. Make sure you discuss any questions you have with your health care provider.

## 2014-06-21 NOTE — Progress Notes (Signed)
Subjective:     Patient ID: Krystal West, female   DOB: 01-31-1971, 43 y.o.   MRN: [id number]  HPI [id number].  Pt reports that she has 2 cycles per month assoc with pain for several months.  She reports that she was not sexually active for 1 year until yesterday.  She reports that she wants relief of pain and bleeding.       Review of Systems     Objective:   Physical Exam BP 95/73 mmHg  Pulse 74  Temp(Src) 98.7 F (37.1 C) (Oral)  Ht 5\' 5"  (1.651 m)  Wt 184 lb (83.462 kg)  BMI 30.62 kg/m2  LMP 06/04/2014  The indications for endometrial biopsy were reviewed.   Risks of the biopsy including cramping, bleeding, infection, uterine perforation, inadequate specimen and need for additional procedures  were discussed. The patient states she understands and agrees to undergo procedure today. Consent was signed. Time out was performed. Urine HCG was negative. A sterile speculum was placed in the patient's vagina and the cervix was prepped with Betadine. A single-toothed tenaculum was placed on the anterior lip of the cervix to stabilize it. The 3 mm pipelle was introduced into the endometrial cavity without difficulty to a depth of 8cm, and a moderate amount of tissue was obtained and sent to pathology. The instruments were removed from the patient's vagina. Minimal bleeding from the cervix was noted. The patient tolerated the procedure well.   04/27/2014 EXAM: TRANSABDOMINAL AND TRANSVAGINAL ULTRASOUND OF PELVIS  TECHNIQUE: Both transabdominal and transvaginal ultrasound examinations of the pelvis were performed. Transabdominal technique was performed for global imaging of the pelvis including uterus, ovaries, adnexal regions, and pelvic cul-de-sac. It was necessary to proceed with endovaginal exam following the transabdominal exam to visualize the endometrium.  COMPARISON: Pelvic ultrasound 05/10/2009  FINDINGS: Uterus  Measurements: 11.2 x 6.0 x 7.0 cm. Within the left aspect of  the uterine body there is a 1.5 x 1.3 x 1.2 cm intramural fibroid. Within the posterior uterine body there is a 1.0 x 0.8 x 1.0 cm intramural fibroid.  Endometrium  Thickness: 14 mm. No focal abnormality visualized.  Right ovary  Measurements: 3.0 x 1.4 x 2.2 cm. Normal appearance/no adnexal mass.  Left ovary  Measurements: 3.1 x 1.5 x 3.2 cm. Normal appearance/no adnexal mass.  Other findings  No free fluid.  IMPRESSION: Two small intramural fibroids.  Endometrium measures 14 mm. If bleeding remains unresponsive to hormonal or medical therapy, sonohysterogram should be considered for focal lesion work-up. (Ref: Radiological Reasoning: Algorithmic Workup of Abnormal Vaginal Bleeding with Endovaginal Sonography and Sonohysterography. AJR 2008; 735:H29-92)     Assessment:     menorrhagia with intermenstrual bleeding    Plan:     F/u PAP F/u endometrial bx Sprintec 1 po q day Naproxen 500mg  bid prn  Routine post-procedure instructions were given to the patient. The patient will follow up to review the results and for further management.

## 2014-06-23 LAB — CYTOLOGY - PAP

## 2014-06-26 ENCOUNTER — Telehealth: Payer: Self-pay | Admitting: *Deleted

## 2014-06-26 NOTE — Telephone Encounter (Signed)
-----   Message from Lavonia Drafts, MD sent at 06/25/2014  2:18 PM EST ----- Please call pt. Bother her PAP and her endo bx were normal.  Thx clh-S

## 2014-06-26 NOTE — Telephone Encounter (Signed)
Called patient and informed her of results.  

## 2014-07-21 ENCOUNTER — Encounter: Payer: Self-pay | Admitting: *Deleted

## 2015-04-03 ENCOUNTER — Ambulatory Visit: Payer: Medicaid Other | Admitting: Family Medicine

## 2015-04-10 ENCOUNTER — Encounter: Payer: Self-pay | Admitting: Family Medicine

## 2015-04-10 ENCOUNTER — Ambulatory Visit (INDEPENDENT_AMBULATORY_CARE_PROVIDER_SITE_OTHER): Payer: Medicaid Other | Admitting: Family Medicine

## 2015-04-10 VITALS — BP 110/68 | HR 83 | Temp 98.6°F | Ht 65.0 in | Wt 200.9 lb

## 2015-04-10 DIAGNOSIS — N949 Unspecified condition associated with female genital organs and menstrual cycle: Secondary | ICD-10-CM

## 2015-04-10 DIAGNOSIS — Z0000 Encounter for general adult medical examination without abnormal findings: Secondary | ICD-10-CM | POA: Diagnosis not present

## 2015-04-10 DIAGNOSIS — Z130 Encounter for screening for diseases of the blood and blood-forming organs and certain disorders involving the immune mechanism: Secondary | ICD-10-CM

## 2015-04-10 DIAGNOSIS — N946 Dysmenorrhea, unspecified: Secondary | ICD-10-CM | POA: Diagnosis not present

## 2015-04-10 DIAGNOSIS — K649 Unspecified hemorrhoids: Secondary | ICD-10-CM

## 2015-04-10 DIAGNOSIS — E669 Obesity, unspecified: Secondary | ICD-10-CM | POA: Diagnosis not present

## 2015-04-10 DIAGNOSIS — F319 Bipolar disorder, unspecified: Secondary | ICD-10-CM

## 2015-04-10 DIAGNOSIS — G8929 Other chronic pain: Secondary | ICD-10-CM

## 2015-04-10 DIAGNOSIS — R102 Pelvic and perineal pain: Secondary | ICD-10-CM

## 2015-04-10 MED ORDER — ACETAMINOPHEN-CODEINE #3 300-30 MG PO TABS: 1.0000 | ORAL_TABLET | Freq: Four times a day (QID) | ORAL | Status: DC | PRN

## 2015-04-10 MED ORDER — HYDROCORTISONE 2.5 % RE CREA: TOPICAL_CREAM | RECTAL | Status: DC

## 2015-04-10 NOTE — Progress Notes (Signed)
Patient ID: Krystal West, female   DOB: 1971/01/10, 44 y.o.   MRN: [id number]   Krystal West presents to office today for her annual physical examination.  Concerns today include:  1. Hemorrhoids Patient reports that she occasionally has straining and rectal bleeding.  She has had this in the past and used Hemorrhoid cr that was Rxd.  This helps relieve symptoms well.  No weight loss, diarrhea, nausea, vomiting.  2. Dysmenorrhea Patient reports that she has had ongoing pelvic pain related to menstrual cycles.  She has been evaluated previously and offered surgery for fibroid removal.  She declines this.  In the past she has used Naproxen, Motrin, Mobic with no relief.  She uses heating pad with minimal relief and reports that she has been taking her friends medications to help with the pain.  She has used Tylenol #3 and Norco.  Both work well to relieve pain. Patient's last menstrual period was 03/09/2015.  Last about 10-11 days at a time.  Has not been using OCPs because she reports this makes her gain weight.  Reports that she chews Ice a lot.  Has recently started taking her MV again.  Does not feel that she has Fe deficiency.  Last eye exam: 2016 Last dental exam: 2016 Last pap smear: 06/21/2014, normal Immunizations needed: TDap, flu Refills needed today: Hemmorrhoid cr   History reviewed. No pertinent past medical history. Social History   Social History  . Marital Status: Single    Spouse Name: N/A  . Number of Children: N/A  . Years of Education: N/A   Occupational History  . Not on file.   Social History Main Topics  . Smoking status: Never Smoker   . Smokeless tobacco: Never Used  . Alcohol Use: 0.0 oz/week    1-2 Shots of liquor per week  . Drug Use: Yes    Special: Cocaine, Marijuana  . Sexual Activity: Yes    Birth Control/ Protection: Surgical   Other Topics Concern  . Not on file   Social History Narrative   Unemployed, applying for disability.  Graduated from high  school, some college.  History of clerical jobs, Glass blower/designer.  Lives with 4 children (ages daughter age 68, boys 16-19)   Past Surgical History  Procedure Laterality Date  . Tubal ligation  1995   Family History  Problem Relation Age of Onset  . Hypertension Mother   . Hypertension Father   . Heart disease Father   . Schizophrenia Brother   . Multiple sclerosis Brother   . Cancer Maternal Grandmother     breast ca  . Stroke Neg Hx   . Diabetes Neg Hx   . Schizophrenia Brother     ROS: Review of Systems Constitutional: negative Eyes: positive for contacts/glasses Ears, nose, mouth, throat, and face: positive for rhinorrhea Respiratory: positive for chest congestion Cardiovascular: negative Gastrointestinal: positive for hemorrhoids Genitourinary:positive for dysmenorrhea Integument/breast: negative Hematologic/lymphatic: negative Musculoskeletal:negative Neurological: negative Behavioral/Psych: positive for bipolar d/o Endocrine: negative Allergic/Immunologic: negative   Physical exam BP 110/68 mmHg  Pulse 83  Temp(Src) 98.6 F (37 C) (Oral)  Ht 5\' 5"  (1.651 m)  Wt 200 lb 14.4 oz (91.128 kg)  BMI 33.43 kg/m2  LMP 03/09/2015 General appearance: alert, cooperative, appears stated age and no distress Head: Normocephalic, without obvious abnormality, atraumatic Eyes: conjunctivae/corneas clear. PERRL, EOM's intact. Fundi benign. Ears: normal TM's and external ear canals both ears Nose: Nares normal. Septum midline. Mucosa normal. No drainage or sinus tenderness. Throat: lips,  mucosa, and tongue normal; teeth and gums normal Neck: no adenopathy, no carotid bruit, no JVD, supple, symmetrical, trachea midline and thyroid not enlarged, symmetric, no tenderness/mass/nodules Back: symmetric, no curvature. ROM normal. No CVA tenderness. Lungs: clear to auscultation bilaterally Breasts: normal appearance, no masses or tenderness, No nipple retraction or dimpling, No  nipple discharge or bleeding, No axillary or supraclavicular adenopathy, Normal to palpation without dominant masses Heart: regular rate and rhythm, S1, S2 normal, no murmur, click, rub or gallop Abdomen: soft, non-tender; bowel sounds normal; no masses,  no organomegaly Extremities: extremities normal, atraumatic, no cyanosis or edema Pulses: 2+ and symmetric Skin: Skin color, texture, turgor normal. No rashes or lesions Lymph nodes: Cervical, supraclavicular, and axillary nodes normal. Neurologic: Alert and oriented X 3, normal strength and tone. Normal symmetric reflexes. Normal coordination and gait    Assessment/ Plan: Patient with h/o Bipolar d/o and pelvic pain here for annual physical exam.   Chronic pelvic pain in female Dysmenorrhea.  Periods last 10-11 days.  NSAIDs not effective.  Self discontinued OCPs 2/2 weight gain -Discussed NOT using medications not prescribed to her. -Consider Mobic but HIGH risk of interaction with Lithium, therefore Rxd short supply of Tylenol #3 to patient.  Discussed that this is NOT a long term medication -Referral to Gyn  -Continue heating pad -Return PRN  Bipolar disorder Sees Dr Hiram Comber q3 months.  Doing well.  Compliant with medications.  No concerns at this time. -Defer psych meds to Dr Hiram Comber.  Hemorrhoids -Refill hemmorhoidal HC cr  -CBC  Obesity -CMET -TSH  Follow up in 1 year or sooner if needed.  Krystal M. Lajuana Ripple, DO PGY-2, [place]

## 2015-04-10 NOTE — Assessment & Plan Note (Addendum)
Dysmenorrhea.  Periods last 10-11 days.  NSAIDs not effective.  Self discontinued OCPs 2/2 weight gain -Discussed NOT using medications not prescribed to her. -Consider Mobic but HIGH risk of interaction with Lithium, therefore Rxd short supply of Tylenol #3 to patient.  Discussed that this is NOT a long term medication -Referral to Gyn  -Continue heating pad -Return PRN

## 2015-04-10 NOTE — Patient Instructions (Signed)
It was a pleasure seeing you today, [name].  Information regarding what we discussed is included in this packet.  Please make an appointment to see me in 1 year or sooner if needed. I will contact you will the results of your labs.  If anything is abnormal, I will call you.  Otherwise, expect a copy to be mailed to you.  Please feel free to call our office at [phone] if any questions or concerns arise.  Warm Regards, Vernadette Stutsman M. Damek Ende, DO Health Maintenance Adopting a healthy lifestyle and getting preventive care can go a long way to promote health and wellness. Talk with your health care provider about what schedule of regular examinations is right for you. This is a good chance for you to check in with your provider about disease prevention and staying healthy. In between checkups, there are plenty of things you can do on your own. Experts have done a lot of research about which lifestyle changes and preventive measures are most likely to keep you healthy. Ask your health care provider for more information. WEIGHT AND DIET  Eat a healthy diet  Be sure to include plenty of vegetables, fruits, low-fat dairy products, and lean protein.  Do not eat a lot of foods high in solid fats, added sugars, or salt.  Get regular exercise. This is one of the most important things you can do for your health.  Most adults should exercise for at least 150 minutes each week. The exercise should increase your heart rate and make you sweat (moderate-intensity exercise).  Most adults should also do strengthening exercises at least twice a week. This is in addition to the moderate-intensity exercise.  Maintain a healthy weight  Body mass index (BMI) is a measurement that can be used to identify possible weight problems. It estimates body fat based on height and weight. Your health care provider can help determine your BMI and help you achieve or maintain a healthy weight.  For females 3 years of age  and older:   A BMI below 18.5 is considered underweight.  A BMI of 18.5 to 24.9 is normal.  A BMI of 25 to 29.9 is considered overweight.  A BMI of 30 and above is considered obese.  Watch levels of cholesterol and blood lipids  You should start having your blood tested for lipids and cholesterol at 42 years of age, then have this test every 5 years.  You may need to have your cholesterol levels checked more often if:  Your lipid or cholesterol levels are high.  You are older than 42 years of age.  You are at high risk for heart disease.  CANCER SCREENING   Lung Cancer  Lung cancer screening is recommended for adults 14-65 years old who are at high risk for lung cancer because of a history of smoking.  A yearly low-dose CT scan of the lungs is recommended for people who:  Currently smoke.  Have quit within the past 15 years.  Have at least a 30-pack-year history of smoking. A pack year is smoking an average of one pack of cigarettes a day for 1 year.  Yearly screening should continue until it has been 15 years since you quit.  Yearly screening should stop if you develop a health problem that would prevent you from having lung cancer treatment.  Breast Cancer  Practice breast self-awareness. This means understanding how your breasts normally appear and feel.  It also means doing regular breast self-exams. Let  your health care provider know about any changes, no matter how small.  If you are in your 20s or 30s, you should have a clinical breast exam (CBE) by a health care provider every 1-3 years as part of a regular health exam.  If you are 80 or older, have a CBE every year. Also consider having a breast X-ray (mammogram) every year.  If you have a family history of breast cancer, talk to your health care provider about genetic screening.  If you are at high risk for breast cancer, talk to your health care provider about having an MRI and a mammogram every  year.  Breast cancer gene (BRCA) assessment is recommended for women who have family members with BRCA-related cancers. BRCA-related cancers include:  Breast.  Ovarian.  Tubal.  Peritoneal cancers.  Results of the assessment will determine the need for genetic counseling and BRCA1 and BRCA2 testing. Cervical Cancer Routine pelvic examinations to screen for cervical cancer are no longer recommended for nonpregnant women who are considered low risk for cancer of the pelvic organs (ovaries, uterus, and vagina) and who do not have symptoms. A pelvic examination may be necessary if you have symptoms including those associated with pelvic infections. Ask your health care provider if a screening pelvic exam is right for you.   The Pap test is the screening test for cervical cancer for women who are considered at risk.  If you had a hysterectomy for a problem that was not cancer or a condition that could lead to cancer, then you no longer need Pap tests.  If you are older than 65 years, and you have had normal Pap tests for the past 10 years, you no longer need to have Pap tests.  If you have had past treatment for cervical cancer or a condition that could lead to cancer, you need Pap tests and screening for cancer for at least 20 years after your treatment.  If you no longer get a Pap test, assess your risk factors if they change (such as having a new sexual partner). This can affect whether you should start being screened again.  Some women have medical problems that increase their chance of getting cervical cancer. If this is the case for you, your health care provider may recommend more frequent screening and Pap tests.  The human papillomavirus (HPV) test is another test that may be used for cervical cancer screening. The HPV test looks for the virus that can cause cell changes in the cervix. The cells collected during the Pap test can be tested for HPV.  The HPV test can be used to screen  women 15 years of age and older. Getting tested for HPV can extend the interval between normal Pap tests from three to five years.  An HPV test also should be used to screen women of any age who have unclear Pap test results.  After 42 years of age, women should have HPV testing as often as Pap tests.  Colorectal Cancer  This type of cancer can be detected and often prevented.  Routine colorectal cancer screening usually begins at 42 years of age and continues through 42 years of age.  Your health care provider may recommend screening at an earlier age if you have risk factors for colon cancer.  Your health care provider may also recommend using home test kits to check for hidden blood in the stool.  A small camera at the end of a tube can be used to  examine your colon directly (sigmoidoscopy or colonoscopy). This is done to check for the earliest forms of colorectal cancer.  Routine screening usually begins at age 25.  Direct examination of the colon should be repeated every 5-10 years through 42 years of age. However, you may need to be screened more often if early forms of precancerous polyps or small growths are found. Skin Cancer  Check your skin from head to toe regularly.  Tell your health care provider about any new moles or changes in moles, especially if there is a change in a mole's shape or color.  Also tell your health care provider if you have a mole that is larger than the size of a pencil eraser.  Always use sunscreen. Apply sunscreen liberally and repeatedly throughout the day.  Protect yourself by wearing long sleeves, pants, a wide-brimmed hat, and sunglasses whenever you are outside. HEART DISEASE, DIABETES, AND HIGH BLOOD PRESSURE   Have your blood pressure checked at least every 1-2 years. High blood pressure causes heart disease and increases the risk of stroke.  If you are between 88 years and 54 years old, ask your health care provider if you should take  aspirin to prevent strokes.  Have regular diabetes screenings. This involves taking a blood sample to check your fasting blood sugar level.  If you are at a normal weight and have a low risk for diabetes, have this test once every three years after 41 years of age.  If you are overweight and have a high risk for diabetes, consider being tested at a younger age or more often. PREVENTING INFECTION  Hepatitis B  If you have a higher risk for hepatitis B, you should be screened for this virus. You are considered at high risk for hepatitis B if:  You were born in a country where hepatitis B is common. Ask your health care provider which countries are considered high risk.  Your parents were born in a high-risk country, and you have not been immunized against hepatitis B (hepatitis B vaccine).  You have HIV or AIDS.  You use needles to inject street drugs.  You live with someone who has hepatitis B.  You have had sex with someone who has hepatitis B.  You get hemodialysis treatment.  You take certain medicines for conditions, including cancer, organ transplantation, and autoimmune conditions. Hepatitis C  Blood testing is recommended for:  Everyone born from [birth year] through 1963.  Anyone with known risk factors for hepatitis C. Sexually transmitted infections (STIs)  You should be screened for sexually transmitted infections (STIs) including gonorrhea and chlamydia if:  You are sexually active and are younger than 43 years of age.  You are older than 42 years of age and your health care provider tells you that you are at risk for this type of infection.  Your sexual activity has changed since you were last screened and you are at an increased risk for chlamydia or gonorrhea. Ask your health care provider if you are at risk.  If you do not have HIV, but are at risk, it may be recommended that you take a prescription medicine daily to prevent HIV infection. This is called  pre-exposure prophylaxis (PrEP). You are considered at risk if:  You are sexually active and do not regularly use condoms or know the HIV status of your partner(s).  You take drugs by injection.  You are sexually active with a partner who has HIV. Talk with your health care provider about  whether you are at high risk of being infected with HIV. If you choose to begin PrEP, you should first be tested for HIV. You should then be tested every 3 months for as long as you are taking PrEP.  PREGNANCY   If you are premenopausal and you may become pregnant, ask your health care provider about preconception counseling.  If you may become pregnant, take 400 to 800 micrograms (mcg) of folic acid every day.  If you want to prevent pregnancy, talk to your health care provider about birth control (contraception). OSTEOPOROSIS AND MENOPAUSE   Osteoporosis is a disease in which the bones lose minerals and strength with aging. This can result in serious bone fractures. Your risk for osteoporosis can be identified using a bone density scan.  If you are 82 years of age or older, or if you are at risk for osteoporosis and fractures, ask your health care provider if you should be screened.  Ask your health care provider whether you should take a calcium or vitamin D supplement to lower your risk for osteoporosis.  Menopause may have certain physical symptoms and risks.  Hormone replacement therapy may reduce some of these symptoms and risks. Talk to your health care provider about whether hormone replacement therapy is right for you.  HOME CARE INSTRUCTIONS   Schedule regular health, dental, and eye exams.  Stay current with your immunizations.   Do not use any tobacco products including cigarettes, chewing tobacco, or electronic cigarettes.  If you are pregnant, do not drink alcohol.  If you are breastfeeding, limit how much and how often you drink alcohol.  Limit alcohol intake to no more than 1  drink per day for nonpregnant women. One drink equals 12 ounces of beer, 5 ounces of wine, or 1 ounces of hard liquor.  Do not use street drugs.  Do not share needles.  Ask your health care provider for help if you need support or information about quitting drugs.  Tell your health care provider if you often feel depressed.  Tell your health care provider if you have ever been abused or do not feel safe at home. Document Released: 02/03/2011 Document Revised: 12/05/2013 Document Reviewed: 06/22/2013 [facility] Patient Information 2015 [place], [place]. This information is not intended to replace advice given to you by your health care provider. Make sure you discuss any questions you have with your health care provider.

## 2015-04-10 NOTE — Assessment & Plan Note (Signed)
Sees Dr Hiram Comber q3 months.  Doing well.  Compliant with medications.  No concerns at this time. -Defer psych meds to Dr Hiram Comber.

## 2015-05-24 ENCOUNTER — Ambulatory Visit: Payer: Medicaid Other | Admitting: Certified Nurse Midwife

## 2015-06-01 ENCOUNTER — Ambulatory Visit (INDEPENDENT_AMBULATORY_CARE_PROVIDER_SITE_OTHER): Payer: Medicaid Other | Admitting: Internal Medicine

## 2015-06-01 ENCOUNTER — Encounter: Payer: Self-pay | Admitting: Internal Medicine

## 2015-06-01 VITALS — BP 129/79 | HR 85 | Temp 97.8°F | Ht 65.0 in | Wt 212.4 lb

## 2015-06-01 DIAGNOSIS — M79661 Pain in right lower leg: Secondary | ICD-10-CM

## 2015-06-01 DIAGNOSIS — N946 Dysmenorrhea, unspecified: Secondary | ICD-10-CM | POA: Diagnosis not present

## 2015-06-01 DIAGNOSIS — R070 Pain in throat: Secondary | ICD-10-CM | POA: Diagnosis not present

## 2015-06-01 MED ORDER — OMEPRAZOLE 20 MG PO CPDR: 20.0000 mg | DELAYED_RELEASE_CAPSULE | Freq: Every day | ORAL | Status: DC

## 2015-06-01 MED ORDER — ACETAMINOPHEN-CODEINE #3 300-30 MG PO TABS: 1.0000 | ORAL_TABLET | Freq: Four times a day (QID) | ORAL | Status: DC | PRN

## 2015-06-01 NOTE — Progress Notes (Signed)
[facility] Phone: [phone]  Subjective:  "Fat Pockets": First noticed them 1-2 months ago. Located on medial surface of upper legs bilaterally. Started noticing pain shooting down right lower extremity to ankle. Pain occurs randomly. Tried heat, which helped a little. Also tried Ibuprofen, which helped a little. No change in size of the fat pockets. No changes to overlying skin. No swelling in lower extremities. Has been trying to lose weight so has started walking daily for the last week. Is walking 5 miles a day currently. Started walking last week. Has also been using a stepper in her house. She does not feel like this is related to a manic episode.  Burning in throat: Burning in her chest and throat for a couple months. Occurs sporadically throughout the day. Has tried Tums, which have helped. Not worse with any foods. No changes in bowel movements. No black, tarry stools or bloody stools.  All other ROS were reviewed and are negative unless otherwise noted in the HPI. Past Medical History- significant for bipolar disorder, anxiety, depression. Reviewed problem list.  Medications- reviewed and updated Current Outpatient Prescriptions  Medication Sig Dispense Refill  . acetaminophen-codeine (TYLENOL #3) 300-30 MG tablet Take 1 tablet by mouth every 6 (six) hours as needed for moderate pain (severe menstrual pain). 30 tablet 2  . clonazePAM (KLONOPIN) 1 MG tablet Take 1 mg by mouth 2 (two) times daily.    . fluticasone (FLONASE) 50 MCG/ACT nasal spray Place 2 sprays into the nose daily. (Patient not taking: Reported on 04/10/2015) 16 g 6  . HYDROcodone-acetaminophen (NORCO/VICODIN) 5-325 MG per tablet 1 to 2 tabs every 4 to 6 hours as needed for pain. 20 tablet 0  . hydrocortisone (ANUSOL-HC) 2.5 % rectal cream Apply rectally 2 times daily 30 g 2  . Hydrocortisone Acetate (TUCKS ANTI-ITCH) 1 % OINT Apply topical as needed. 30 g 0  . lithium carbonate (ESKALITH)  450 MG CR tablet Take by mouth 2 (two) times daily.    Marland Kitchen omeprazole (PRILOSEC) 20 MG capsule Take 1 capsule (20 mg total) by mouth daily. 30 capsule 3  . polyethylene glycol powder (GLYCOLAX/MIRALAX) powder Take 17 g by mouth daily as needed (Constipation.). 3350 g 1   No current facility-administered medications for this visit.   Chief complaint-noted Family history reviewed for today's visit. No changes. Social history- patient is a never smoker. History of cocaine and marijuana use.  Objective: BP 129/79 mmHg  Pulse 85  Temp(Src) 97.8 F (36.6 C) (Oral)  Ht 5\' 5"  (1.651 m)  Wt 212 lb 6.4 oz (96.344 kg)  BMI 35.35 kg/m2  LMP 05/31/2015 Gen: NAD, alert, cooperative with exam HEENT: NCAT, EOMI, MMM, no throat erythema Neck: FROM, supple CV: RRR, good S1/S2, no murmur, 2+ DP pulses bilaterally Resp: CTABL, no wheezes, non-labored GI: Non-distended Msk: No edema, warm, normal tone, moves UE/LE spontaneously, Homan's sign negative, no tenderness to palpation of calves bilaterally; fat pads present over medial lower leg directly underneath medial joint line; TTP down the anterior lower leg directly lateral to the tibia. Neuro: Alert and oriented, no gross deficits Skin: no rashes, no lesions Psych: Appropriate behavior  Assessment/Plan: Fat Pouches/Pain in RLE: Patient is tender directly lateral to tibia all the way down her lower leg, occurred after she recently started walking 5 miles per day. Presentation is most consistent with shin splints.  - Patient given handout and exercises for shin splints. - Patient advised to ice shins immediately after walking. -  Reassurance provided  - Patient to follow-up as needed  Burning in Throat: Could be related to her anxiety vs GERD. - Will start Prilosec - Patient to follow-up with PCP in 2-3 months  Dysmenorrhia: - Refilled Tylenol 3 today   Hyman Bible, MD PGY-1

## 2015-06-01 NOTE — Patient Instructions (Addendum)
Thank you for coming into clinic today. It was so nice to meet you!  I have prescribed Prilosec for your acid reflux. This should help the burning. Please try to stay away from any sodas or spicy foods.  I have also refilled your Tylenol 3 prescription.   You are doing such a great job exercising! Please make sure you eat 3 healthy meals per day. We have a nutritionist in our clinic who you can see in the future if you would like to.  I have attached some exercises below to help with your shin splints.  -Dr. Marlis Edelson Splints Shin splints is a painful condition that is felt on the bone that is located in the front of the lower leg (tibia or shin bone) or in the muscles on either side of the bone. This condition happens when physical activities lead to inflammation of the muscles, tendons, and the thin layer that covers the shin bone. It may result from participating in sports or other demanding exercise. CAUSES This condition may be caused by:  Overuse of muscles.  Repetitive activities.  Flat feet or rigid arches. Activities that could contribute to shin splints include:  Having a sudden increase in exercise time.  Starting a new, demanding activity.  Running up hills or long distances.  Playing sports that involve sudden starts and stops.  Using a poor warm-up.  Wearing old or worn-out shoes. SYMPTOMS Symptoms of this condition include:  Pain on the front of the lower leg.  Pain while exercising or at rest. DIAGNOSIS This condition may be diagnosed based on a physical exam and the history of your symptoms. Your health care provider may observe you as you walk or run. You may also have X-rays or other imaging tests to rule out other problems that could cause lower leg pain, such as a stress fracture. TREATMENT Treatment for this condition may depend on your age, history, health, and how bad the pain is. Most cases of shin splints can be managed by doing one or more of  the following:  Resting.  Reducing the length and intensity of your exercise.  Stopping the activity that causes shin pain.  Taking medicines to control the inflammation.  Icing, massaging, stretching, and strengthening the affected area.  Wearing shoes that have rigid heels, shock absorption, and a good arch support. HOME CARE INSTRUCTIONS Injury Care  If directed, apply ice to the injured area:  Put ice in a plastic bag.  Place a towel between your skin and the bag.  Leave the ice on for 20 minutes, 2-3 times a day.  Massage, stretch, and strengthen the affected area as directed by your health care provider. Activity  Rest as needed. Return to activity gradually as directed by your health care provider.  Restart your exercise sessions with non-weight-bearing exercises, such as cycling or swimming.  Stop running if the pain returns.  Warm up properly before exercising.  Run on a surface that is level and fairly firm.  Gradually change the intensity of an exercise.  If you increase your running distance, add only 5-10% to your distance each week. This means that if you are running 5 miles this week, you should only increase your run by - mile for next week. General Instructions  Take medicines only as directed by your health care provider.  Wear shoes that have rigid heels, shock absorption, and a good arch support.  Change your athletic shoes every 6 months, or every 350-450 miles.  SEEK MEDICAL CARE IF:  Your symptoms continue or they worsen even after treatment.  The location, intensity, or type of pain changes over time.  You have swelling in your lower leg that gets worse.  Your shin becomes red and feels warm. SEEK IMMEDIATE MEDICAL CARE IF:  You have severe pain.  You have trouble walking.   This information is not intended to replace advice given to you by your health care provider. Make sure you discuss any questions you have with your health  care provider.   Document Released: 07/18/2000 Document Revised: 12/05/2014 Document Reviewed: 07/17/2014 Elsevier Interactive Patient Education [employer].

## 2015-06-08 ENCOUNTER — Emergency Department (HOSPITAL_COMMUNITY)
Admission: EM | Admit: 2015-06-08 | Discharge: 2015-06-08 | Disposition: A | Discharge status: Home / Self Care | Payer: Medicaid Other | Source: Home / Self Care | Attending: Family Medicine | Admitting: Family Medicine

## 2015-06-08 ENCOUNTER — Encounter (HOSPITAL_COMMUNITY): Payer: Self-pay

## 2015-06-08 ENCOUNTER — Other Ambulatory Visit (HOSPITAL_COMMUNITY)
Admission: RE | Admit: 2015-06-08 | Discharge: 2015-06-08 | Disposition: A | Discharge status: Home / Self Care | Payer: Medicaid Other | Source: Ambulatory Visit | Attending: Family Medicine | Admitting: Family Medicine

## 2015-06-08 DIAGNOSIS — N898 Other specified noninflammatory disorders of vagina: Secondary | ICD-10-CM

## 2015-06-08 DIAGNOSIS — Z202 Contact with and (suspected) exposure to infections with a predominantly sexual mode of transmission: Secondary | ICD-10-CM | POA: Diagnosis not present

## 2015-06-08 DIAGNOSIS — Z113 Encounter for screening for infections with a predominantly sexual mode of transmission: Secondary | ICD-10-CM | POA: Diagnosis present

## 2015-06-08 DIAGNOSIS — N760 Acute vaginitis: Secondary | ICD-10-CM | POA: Insufficient documentation

## 2015-06-08 LAB — POCT URINALYSIS DIP (DEVICE)
BILIRUBIN URINE: NEGATIVE
Glucose, UA: NEGATIVE mg/dL
Ketones, ur: NEGATIVE mg/dL
LEUKOCYTES UA: NEGATIVE
NITRITE: NEGATIVE
Protein, ur: NEGATIVE mg/dL
Specific Gravity, Urine: 1.01 (ref 1.005–1.030)
Urobilinogen, UA: 0.2 mg/dL (ref 0.0–1.0)
pH: 6 (ref 5.0–8.0)

## 2015-06-08 LAB — POCT PREGNANCY, URINE: Preg Test, Ur: NEGATIVE

## 2015-06-08 MED ORDER — LIDOCAINE HCL (PF) 1 % IJ SOLN
INTRAMUSCULAR | Status: AC
  Filled 2015-06-08: qty 5

## 2015-06-08 MED ORDER — CEFTRIAXONE SODIUM 250 MG IJ SOLR
250.0000 mg | Freq: Once | INTRAMUSCULAR | Status: AC
  Administered 2015-06-08: 250 mg via INTRAMUSCULAR

## 2015-06-08 MED ORDER — CEFTRIAXONE SODIUM 250 MG IJ SOLR
INTRAMUSCULAR | Status: AC
  Filled 2015-06-08: qty 250

## 2015-06-08 MED ORDER — AZITHROMYCIN 250 MG PO TABS
1000.0000 mg | ORAL_TABLET | Freq: Once | ORAL | Status: AC
  Administered 2015-06-08: 1000 mg via ORAL

## 2015-06-08 MED ORDER — AZITHROMYCIN 250 MG PO TABS
ORAL_TABLET | ORAL | Status: AC
  Filled 2015-06-08: qty 4

## 2015-06-08 NOTE — ED Notes (Signed)
Partner has a situation, penile d/c. Patient has vaginal d/c x 1 day. Sexually active w/o condom

## 2015-06-08 NOTE — Discharge Instructions (Signed)
Airborne Precautions Airborne precautions are guidelines for the care of a person who has a disease that spreads through the air (airborne disease). Following these guidelines helps to prevent the disease from spreading to others. GUIDELINES FOR PATIENTS If you have an airborne disease, follow these guidelines during your stay:  You will be treated in a private room with a special air supply. Keep the door to the private room closed. Check with your nurse before you leave the room.  Wear a mask if you go to another area of the hospital or clinic. Make sure that the mask fits tightly.  Cover your mouth with a tissue when you cough.  Cover your nose with a tissue when you sneeze.  Wash your hands often. GUIDELINES FOR VISITORS If you are visiting a person who has an airborne disease, follow these guidelines:  Check with a nurse before you enter a room that has a sign that says "Airborne Precautions."  If you are allowed to enter the room, you will be asked to wash your hands and wear a mask over your nose and mouth. Make sure that the mask fits tightly.  Do not take off your mask in the room.  Do not eat or drink in the room.  Do not use or touch any items in the room unless you ask a nurse first.  Right after you leave the room, take off your mask and throw it in the trash. Then wash your hands.   This information is not intended to replace advice given to you by your health care provider. Make sure you discuss any questions you have with your health care provider.   Document Released: 12/05/2014 Document Reviewed: 12/05/2014 Elsevier Interactive Patient Education [employer].

## 2015-06-08 NOTE — ED Provider Notes (Signed)
CSN: [id number]     Arrival date & time 06/08/15  1448 History   First MD Initiated Contact with Patient 06/08/15 1603     Chief Complaint  Patient presents with  . Exposure to STD   (Consider location/radiation/quality/duration/timing/severity/associated sxs/prior Treatment) HPI Comments: Patient states her boyfriend has problem with pus draining from his penis and she has been sexually active with him and unprotected.  She c/o vaginal discharge x 1 day.  Patient is a 44 y.o. female presenting with STD exposure. The history is provided by the patient.  Exposure to STD This is a new problem. The current episode started yesterday. The problem occurs constantly. The problem has not changed since onset.Nothing aggravates the symptoms. Nothing relieves the symptoms. She has tried nothing for the symptoms.    History reviewed. No pertinent past medical history. Past Surgical History  Procedure Laterality Date  . Tubal ligation  1995   Family History  Problem Relation Age of Onset  . Hypertension Mother   . Hypertension Father   . Heart disease Father   . Schizophrenia Brother   . Multiple sclerosis Brother   . Cancer Maternal Grandmother     breast ca  . Stroke Neg Hx   . Diabetes Neg Hx   . Schizophrenia Brother    Social History  Substance Use Topics  . Smoking status: Never Smoker   . Smokeless tobacco: Never Used  . Alcohol Use: 0.0 oz/week    1-2 Shots of liquor per week   OB History    Gravida Para Term Preterm AB TAB SAB Ectopic Multiple Living   4 4 4   0 0 0 0 0 4     Review of Systems  Constitutional: Negative.   HENT: Negative.   Eyes: Negative.   Respiratory: Negative.   Cardiovascular: Negative.   Gastrointestinal: Negative.   Endocrine: Negative.   Genitourinary: Positive for vaginal discharge.  Musculoskeletal: Negative.   Skin: Negative.   Allergic/Immunologic: Negative.   Neurological: Negative.   Hematological: Negative.   Psychiatric/Behavioral:  Negative.     Allergies  Review of patient's allergies indicates no known allergies.  Home Medications   Prior to Admission medications   Medication Sig Start Date End Date Taking? Authorizing Provider  acetaminophen-codeine (TYLENOL #3) 300-30 MG tablet Take 1 tablet by mouth every 6 (six) hours as needed for moderate pain (severe menstrual pain). 06/01/15   Sela Hua, MD  clonazePAM (KLONOPIN) 1 MG tablet Take 1 mg by mouth 2 (two) times daily.    Historical Provider, MD  fluticasone (FLONASE) 50 MCG/ACT nasal spray Place 2 sprays into the nose daily. Patient not taking: Reported on 04/10/2015 07/08/12   Cletus Gash, MD  HYDROcodone-acetaminophen (NORCO/VICODIN) 5-325 MG per tablet 1 to 2 tabs every 4 to 6 hours as needed for pain. 01/10/14   Harden Mo, MD  hydrocortisone (ANUSOL-HC) 2.5 % rectal cream Apply rectally 2 times daily 04/10/15   Janora Norlander, DO  Hydrocortisone Acetate (TUCKS ANTI-ITCH) 1 % OINT Apply topical as needed. 12/22/13   Coral Spikes, DO  lithium carbonate (ESKALITH) 450 MG CR tablet Take by mouth 2 (two) times daily.    Historical Provider, MD  omeprazole (PRILOSEC) 20 MG capsule Take 1 capsule (20 mg total) by mouth daily. 06/01/15   Sela Hua, MD  polyethylene glycol powder (GLYCOLAX/MIRALAX) powder Take 17 g by mouth daily as needed (Constipation.). 05/11/13   Coral Spikes, DO   Meds Ordered and Administered  this Visit   Medications  azithromycin (ZITHROMAX) tablet 1,000 mg (not administered)  cefTRIAXone (ROCEPHIN) injection 250 mg (not administered)    BP 117/76 mmHg  Pulse 74  Temp(Src) 98.1 F (36.7 C) (Oral)  Resp 16  SpO2 98%  LMP 05/31/2015 No data found.   Physical Exam  Constitutional: She appears well-developed and well-nourished.  HENT:  Head: Normocephalic.  Right Ear: External ear normal.  Left Ear: External ear normal.  Mouth/Throat: Oropharynx is clear and moist.  Eyes: Conjunctivae and EOM are normal. Pupils  are equal, round, and reactive to light.  Neck: Normal range of motion. Neck supple.  Cardiovascular: Normal rate, regular rhythm and normal heart sounds.   Pulmonary/Chest: Effort normal and breath sounds normal.  Abdominal: Soft. Bowel sounds are normal.  Genitourinary: There is no rash, tenderness or lesion on the right labia. There is no rash, tenderness, lesion or injury on the left labia. No tenderness in the vagina. Vaginal discharge found.    ED Course  Procedures (including critical care time)  Labs Review Labs Reviewed  POCT URINALYSIS DIP (DEVICE) - Abnormal; Notable for the following:    Hgb urine dipstick TRACE (*)    All other components within normal limits  POCT PREGNANCY, URINE  CERVICOVAGINAL ANCILLARY ONLY    Imaging Review No results found.   Visual Acuity Review  Right Eye Distance:   Left Eye Distance:   Bilateral Distance:    Right Eye Near:   Left Eye Near:    Bilateral Near:         MDM   1. STD exposure 2. Vaginal Discharge  Azithromycin 1000mg  now Rocephin 250mg  IM  [place], FNP 06/08/15 [place], [place] 06/08/15 [phone]

## 2015-06-11 LAB — CERVICOVAGINAL ANCILLARY ONLY
Chlamydia: NEGATIVE
Neisseria Gonorrhea: NEGATIVE

## 2015-06-11 NOTE — ED Notes (Signed)
STD report negative; still waiting for wet prep report

## 2015-06-12 LAB — CERVICOVAGINAL ANCILLARY ONLY: Wet Prep (BD Affirm): POSITIVE — AB

## 2015-06-12 NOTE — ED Notes (Signed)
Final report of vaginal swabs negative for STD, positive for gardnerella. Discussed w S Mortenson, who authorized flagyl 500 mg PO, BID x 7 days, NR. Called patient, and after verifying ID discussed findings . Was assured this is not contagious, she did not get or give an infection to anyone. Called Rx to [facility], [address] at patient request. Spoke directly w pharmacist

## 2015-08-14 ENCOUNTER — Emergency Department (HOSPITAL_COMMUNITY)
Admission: EM | Admit: 2015-08-14 | Discharge: 2015-08-14 | Disposition: A | Discharge status: Other Acute Inpatient Hospital | Payer: Medicaid Other

## 2015-08-14 ENCOUNTER — Emergency Department (HOSPITAL_COMMUNITY)
Admission: EM | Admit: 2015-08-14 | Discharge: 2015-08-14 | Disposition: A | Discharge status: Home / Self Care | Payer: Medicaid Other | Attending: Emergency Medicine | Admitting: Emergency Medicine

## 2015-08-14 ENCOUNTER — Encounter (HOSPITAL_COMMUNITY): Payer: Self-pay | Admitting: Emergency Medicine

## 2015-08-14 DIAGNOSIS — Z7952 Long term (current) use of systemic steroids: Secondary | ICD-10-CM | POA: Insufficient documentation

## 2015-08-14 DIAGNOSIS — Z79899 Other long term (current) drug therapy: Secondary | ICD-10-CM | POA: Insufficient documentation

## 2015-08-14 DIAGNOSIS — M5440 Lumbago with sciatica, unspecified side: Secondary | ICD-10-CM | POA: Diagnosis not present

## 2015-08-14 DIAGNOSIS — R110 Nausea: Secondary | ICD-10-CM | POA: Diagnosis not present

## 2015-08-14 DIAGNOSIS — M545 Low back pain: Secondary | ICD-10-CM

## 2015-08-14 MED ORDER — KETOROLAC TROMETHAMINE 60 MG/2ML IM SOLN
60.0000 mg | Freq: Once | INTRAMUSCULAR | Status: AC
  Administered 2015-08-14: 60 mg via INTRAMUSCULAR
  Filled 2015-08-14: qty 2

## 2015-08-14 MED ORDER — DEXAMETHASONE SODIUM PHOSPHATE 10 MG/ML IJ SOLN
10.0000 mg | Freq: Once | INTRAMUSCULAR | Status: AC
  Administered 2015-08-14: 10 mg via INTRAMUSCULAR
  Filled 2015-08-14: qty 1

## 2015-08-14 MED ORDER — NAPROXEN 500 MG PO TABS: 500.0000 mg | ORAL_TABLET | Freq: Two times a day (BID) | ORAL | Status: DC

## 2015-08-14 MED ORDER — ACETAMINOPHEN 325 MG PO TABS
650.0000 mg | ORAL_TABLET | Freq: Once | ORAL | Status: AC
  Administered 2015-08-14: 650 mg via ORAL
  Filled 2015-08-14: qty 2

## 2015-08-14 NOTE — ED Provider Notes (Signed)
CSN: [id number]     Arrival date & time 08/14/15  1618 History  By signing my name below, I, Krystal West, attest that this documentation has been prepared under the direction and in the presence of Gloriann Loan, PA-C. Electronically Signed: Hansel West, ED Scribe. 08/14/2015. 5:03 PM.    Chief Complaint  Patient presents with  . Back Pain   The history is provided by the patient. No language interpreter was used.    HPI Comments: Krystal West is a 45 y.o. female who presents to the Emergency Department complaining of moderate, sharp lower back pain with radiation to bilateral legs onset 3 days ago. Pt notes associated transient nausea this morning. She reports that she woke up with her current pain 3 days ago. Pt states h/o similar pain, but not as severe. She denies recent trauma, injury, heavy lifting, falls, twisting, bending. She has tried 81mg  ASA, 800mg  Ibuprofen with temporary relief. Pt states that pain is worsened with movement, ambulation and bending. No Hx of cancer, IV drug use, back surgery. Pt is followed by a PCP. She denies numbness or focal weakness in BLE, abdominal pain, dysuria, hematuria, nausea, emesis, fever, bowel or bladder incontinence, saddle anesthesia.   History reviewed. No pertinent past medical history. Past Surgical History  Procedure Laterality Date  . Tubal ligation  1995   Family History  Problem Relation Age of Onset  . Hypertension Mother   . Hypertension Father   . Heart disease Father   . Schizophrenia Brother   . Multiple sclerosis Brother   . Cancer Maternal Grandmother     breast ca  . Stroke Neg Hx   . Diabetes Neg Hx   . Schizophrenia Brother    Social History  Substance Use Topics  . Smoking status: Never Smoker   . Smokeless tobacco: Never Used  . Alcohol Use: 0.0 oz/week    1-2 Shots of liquor per week   OB History    Gravida Para Term Preterm AB TAB SAB Ectopic Multiple Living   4 4 4   0 0 0 0 0 4     Review of Systems A  complete 10 system review of systems was obtained and all systems are negative except as noted in the HPI and PMH.    Allergies  Review of patient's allergies indicates no known allergies.  Home Medications   Prior to Admission medications   Medication Sig Start Date End Date Taking? Authorizing Provider  acetaminophen-codeine (TYLENOL #3) 300-30 MG tablet Take 1 tablet by mouth every 6 (six) hours as needed for moderate pain (severe menstrual pain). 06/01/15   Sela Hua, MD  clonazePAM (KLONOPIN) 1 MG tablet Take 1 mg by mouth 2 (two) times daily.    Historical Provider, MD  fluticasone (FLONASE) 50 MCG/ACT nasal spray Place 2 sprays into the nose daily. Patient not taking: Reported on 04/10/2015 07/08/12   Cletus Gash, MD  HYDROcodone-acetaminophen (NORCO/VICODIN) 5-325 MG per tablet 1 to 2 tabs every 4 to 6 hours as needed for pain. 01/10/14   Harden Mo, MD  hydrocortisone (ANUSOL-HC) 2.5 % rectal cream Apply rectally 2 times daily 04/10/15   Janora Norlander, DO  Hydrocortisone Acetate (TUCKS ANTI-ITCH) 1 % OINT Apply topical as needed. 12/22/13   Coral Spikes, DO  lithium carbonate (ESKALITH) 450 MG CR tablet Take by mouth 2 (two) times daily.    Historical Provider, MD  naproxen (NAPROSYN) 500 MG tablet Take 1 tablet (500 mg total) by mouth  2 (two) times daily. 08/14/15   Gloriann Loan, PA-C  omeprazole (PRILOSEC) 20 MG capsule Take 1 capsule (20 mg total) by mouth daily. 06/01/15   Sela Hua, MD  polyethylene glycol powder (GLYCOLAX/MIRALAX) powder Take 17 g by mouth daily as needed (Constipation.). 05/11/13   Jayce G Cook, DO   BP 147/98 mmHg  Pulse 80  Temp(Src) 98.4 F (36.9 C) (Oral)  Resp 16  Ht 5\' 4"  (1.626 m)  Wt 95.709 kg  BMI 36.20 kg/m2  SpO2 95%  LMP 08/01/2015 Physical Exam  Constitutional: She is oriented to person, place, and time. She appears well-developed and well-nourished.  HENT:  Head: Normocephalic and atraumatic.  Eyes: Conjunctivae and EOM  are normal. Pupils are equal, round, and reactive to light.  Neck: Normal range of motion. Neck supple.  Cardiovascular: Normal rate, regular rhythm and normal heart sounds.   Pulses:      Dorsalis pedis pulses are 2+ on the right side, and 2+ on the left side.  Pulmonary/Chest: Effort normal and breath sounds normal. No respiratory distress.  Abdominal: Soft. Bowel sounds are normal. She exhibits no distension.  Musculoskeletal: Normal range of motion. She exhibits tenderness.       Back:  No lumbar midline spinal tenderness, no step-offs, no deformity. TTP bilaterally of lumbar musculature. No spasm.   Neurological: She is alert and oriented to person, place, and time.  Strength and sensation intact bilaterally throughout the lower extremities. No saddle anesthesia.   Skin: Skin is warm and dry.  Psychiatric: She has a normal mood and affect. Her behavior is normal.  Nursing note and vitals reviewed.    ED Course  Procedures (including critical care time) DIAGNOSTIC STUDIES: Oxygen Saturation is 95% on RA, adequate by my interpretation.    COORDINATION OF CARE: 5:01 PM Discussed treatment plan with pt at bedside which includes continued ibuprofen use and pt agreed to plan. Will administer IM Toradol and Decadron in the ED. Advised pt to try bid Naproxen.     MDM   Final diagnoses:  Bilateral low back pain, with sciatica presence unspecified   Filed Vitals:   08/14/15 1623  BP: 147/98  Pulse: 80  Temp: 98.4 F (36.9 C)  Resp: 16    Meds given in ED:  Medications  ketorolac (TORADOL) injection 60 mg (not administered)  dexamethasone (DECADRON) injection 10 mg (not administered)  acetaminophen (TYLENOL) tablet 650 mg (not administered)    New Prescriptions   NAPROXEN (NAPROSYN) 500 MG TABLET    Take 1 tablet (500 mg total) by mouth 2 (two) times daily.   Patient with back pain.  No neurological deficits and normal neuro exam.  Patient is ambulatory.  No loss of  bowel or bladder control.  No concern for cauda equina. No red flags. No fever, night sweats, weight loss, h/o cancer, IVDA, no recent procedure to back. No urinary symptoms suggestive of UTI.  Supportive care and return precaution discussed. Appears safe for discharge at this time. Follow up as indicated in discharge paperwork.     I personally performed the services described in this documentation, which was scribed in my presence. The recorded information has been reviewed and is accurate.   Gloriann Loan, PA-C 08/14/15 [place], MD 08/14/15 [phone]

## 2015-08-14 NOTE — ED Notes (Signed)
Pt states she woke up Sunday with lower back pain across her entire lower back. Pt denies any urinary symptoms or abdominal pain.

## 2015-08-14 NOTE — Discharge Instructions (Signed)
Back Pain, Adult °Back pain is very common in adults. The cause of back pain is rarely dangerous and the pain often gets better over time. The cause of your back pain may not be known. Some common causes of back pain include: °1. Strain of the muscles or ligaments supporting the spine. °2. Wear and tear (degeneration) of the spinal disks. °3. Arthritis. °4. Direct injury to the back. °For many people, back pain may return. Since back pain is rarely dangerous, most people can learn to manage this condition on their own. °HOME CARE INSTRUCTIONS °Watch your back pain for any changes. The following actions may help to lessen any discomfort you are feeling: °1. Remain active. It is stressful on your back to sit or stand in one place for long periods of time. Do not sit, drive, or stand in one place for more than 30 minutes at a time. Take short walks on even surfaces as soon as you are able. Try to increase the length of time you walk each day. °2. Exercise regularly as directed by your health care provider. Exercise helps your back heal faster. It also helps avoid future injury by keeping your muscles strong and flexible. °3. Do not stay in bed. Resting more than 1-2 days can delay your recovery. °4. Pay attention to your body when you bend and lift. The most comfortable positions are those that put less stress on your recovering back. Always use proper lifting techniques, including: °1. Bending your knees. °2. Keeping the load close to your body. °3. Avoiding twisting. °5. Find a comfortable position to sleep. Use a firm mattress and lie on your side with your knees slightly bent. If you lie on your back, put a pillow under your knees. °6. Avoid feeling anxious or stressed. Stress increases muscle tension and can worsen back pain. It is important to recognize when you are anxious or stressed and learn ways to manage it, such as with exercise. °7. Take medicines only as directed by your health care provider.  Over-the-counter medicines to reduce pain and inflammation are often the most helpful. Your health care provider may prescribe muscle relaxant drugs. These medicines help dull your pain so you can more quickly return to your normal activities and healthy exercise. °8. Apply ice to the injured area: °1. Put ice in a plastic bag. °2. Place a towel between your skin and the bag. °3. Leave the ice on for 20 minutes, 2-3 times a day for the first 2-3 days. After that, ice and heat may be alternated to reduce pain and spasms. °9. Maintain a healthy weight. Excess weight puts extra stress on your back and makes it difficult to maintain good posture. °SEEK MEDICAL CARE IF: °1. You have pain that is not relieved with rest or medicine. °2. You have increasing pain going down into the legs or buttocks. °3. You have pain that does not improve in one week. °4. You have night pain. °5. You lose weight. °6. You have a fever or chills. °SEEK IMMEDIATE MEDICAL CARE IF:  °1. You develop new bowel or bladder control problems. °2. You have unusual weakness or numbness in your arms or legs. °3. You develop nausea or vomiting. °4. You develop abdominal pain. °5. You feel faint. °  °This information is not intended to replace advice given to you by your health care provider. Make sure you discuss any questions you have with your health care provider. °  °Document Released: 07/21/2005 Document Revised: 08/11/2014 Document Reviewed: 11/22/2013 °Elsevier Interactive Patient Education ©2016 [employer]   Inc. ° °Back Exercises °If you have pain in your back, do these exercises 2-3 times each day or as told by your doctor. When the pain goes away, do the exercises once each day, but repeat the steps more times for each exercise (do more repetitions). If you do not have pain in your back, do these exercises once each day or as told by your doctor. °EXERCISES °Single Knee to Chest °Do these steps 3-5 times in a row for each leg: °5. Lie on your back  on a firm bed or the floor with your legs stretched out. °6. Bring one knee to your chest. °7. Hold your knee to your chest by grabbing your knee or thigh. °8. Pull on your knee until you feel a gentle stretch in your lower back. °9. Keep doing the stretch for 10-30 seconds. °10. Slowly let go of your leg and straighten it. °Pelvic Tilt °Do these steps 5-10 times in a row: °10. Lie on your back on a firm bed or the floor with your legs stretched out. °11. Bend your knees so they point up to the ceiling. Your feet should be flat on the floor. °12. Tighten your lower belly (abdomen) muscles to press your lower back against the floor. This will make your tailbone point up to the ceiling instead of pointing down to your feet or the floor. °13. Stay in this position for 5-10 seconds while you gently tighten your muscles and breathe evenly. °Cat-Cow °Do these steps until your lower back bends more easily: °7. Get on your hands and knees on a firm surface. Keep your hands under your shoulders, and keep your knees under your hips. You may put padding under your knees. °8. Let your head hang down, and make your tailbone point down to the floor so your lower back is round like the back of a cat. °9. Stay in this position for 5 seconds. °10. Slowly lift your head and make your tailbone point up to the ceiling so your back hangs low (sags) like the back of a cow. °11. Stay in this position for 5 seconds. °Press-Ups °Do these steps 5-10 times in a row: °6. Lie on your belly (face-down) on the floor. °7. Place your hands near your head, about shoulder-width apart. °8. While you keep your back relaxed and keep your hips on the floor, slowly straighten your arms to raise the top half of your body and lift your shoulders. Do not use your back muscles. To make yourself more comfortable, you may change where you place your hands. °9. Stay in this position for 5 seconds. °10. Slowly return to lying flat on the floor. °Bridges °Do these  steps 10 times in a row: °1. Lie on your back on a firm surface. °2. Bend your knees so they point up to the ceiling. Your feet should be flat on the floor. °3. Tighten your butt muscles and lift your butt off of the floor until your waist is almost as high as your knees. If you do not feel the muscles working in your butt and the back of your thighs, slide your feet 1-2 inches farther away from your butt. °4. Stay in this position for 3-5 seconds. °5. Slowly lower your butt to the floor, and let your butt muscles relax. °If this exercise is too easy, try doing it with your arms crossed over your chest. °Belly Crunches °Do these steps 5-10 times in a row: °1. Lie on your back on   a firm bed or the floor with your legs stretched out. °2. Bend your knees so they point up to the ceiling. Your feet should be flat on the floor. °3. Cross your arms over your chest. °4. Tip your chin a little bit toward your chest but do not bend your neck. °5. Tighten your belly muscles and slowly raise your chest just enough to lift your shoulder blades a tiny bit off of the floor. °6. Slowly lower your chest and your head to the floor. °Back Lifts °Do these steps 5-10 times in a row: °1. Lie on your belly (face-down) with your arms at your sides, and rest your forehead on the floor. °2. Tighten the muscles in your legs and your butt. °3. Slowly lift your chest off of the floor while you keep your hips on the floor. Keep the back of your head in line with the curve in your back. Look at the floor while you do this. °4. Stay in this position for 3-5 seconds. °5. Slowly lower your chest and your face to the floor. °GET HELP IF: °· Your back pain gets a lot worse when you do an exercise. °· Your back pain does not lessen 2 hours after you exercise. °If you have any of these problems, stop doing the exercises. Do not do them again unless your doctor says it is okay. °GET HELP RIGHT AWAY IF: °· You have sudden, very bad back pain. If this  happens, stop doing the exercises. Do not do them again unless your doctor says it is okay. °  °This information is not intended to replace advice given to you by your health care provider. Make sure you discuss any questions you have with your health care provider. °  °Document Released: 08/23/2010 Document Revised: 04/11/2015 Document Reviewed: 09/14/2014 °Elsevier Interactive Patient Education ©2016 [employer]vier Inc. ° °

## 2015-08-14 NOTE — ED Notes (Signed)
See PA assessment 

## 2015-09-24 ENCOUNTER — Encounter: Payer: Self-pay | Admitting: Family Medicine

## 2015-09-24 ENCOUNTER — Ambulatory Visit (INDEPENDENT_AMBULATORY_CARE_PROVIDER_SITE_OTHER): Payer: Medicaid Other | Admitting: Family Medicine

## 2015-09-24 VITALS — BP 117/71 | HR 86 | Temp 97.8°F | Wt 221.5 lb

## 2015-09-24 DIAGNOSIS — N946 Dysmenorrhea, unspecified: Secondary | ICD-10-CM | POA: Diagnosis not present

## 2015-09-24 MED ORDER — ACETAMINOPHEN-CODEINE #3 300-30 MG PO TABS: 1.0000 | ORAL_TABLET | Freq: Four times a day (QID) | ORAL | Status: DC | PRN

## 2015-09-24 NOTE — Assessment & Plan Note (Addendum)
With negative w/u 2010. Declines further work up or interventions.  - Refill tylenol #3 (#30) x1 - Follow up with PCP - Also had question about mammogram (no sxs) which I will defer to PCP.

## 2015-09-24 NOTE — Patient Instructions (Signed)
Make an appointment with Ronnie Doss, DO, your primary doctor. You should discuss mammogram and other treatments for your cramping.

## 2015-09-24 NOTE — Progress Notes (Signed)
Subjective: Krystal West is a 45 y.o. female patient of Dr. Marjean Donna returning for dysmenorrhea.  She reports longstanding (years) extremely painful cramps associated with menses. She has regular monthly cycles since menarche, lasting 10 - 11 days. Menses began yesterday. Ibuprofen and tylenol alone did not help the debilitating pain in her lower abdomen. She has had negative U/S's. Tried OCPs in the past and declines to try these or IUD now. Reports she would decline surgical options even if something actionable was found on imaging.   - ROS: She has no fevers, vaginal discharge, dysuria.  - Non-smoker  Objective: BP 117/71 mmHg  Pulse 86  Temp(Src) 97.8 F (36.6 C) (Oral)  Wt 221 lb 8 oz (100.472 kg)  LMP 09/23/2015 Gen: Well-appearing, obese 45 y.o. female in no distress GI: Soft, obese, nontender.   Assessment/Plan: Krystal West is a 45 y.o. female here for chronic dysmenorrhea.  Dysmenorrhea With negative w/u 2010. Declines further work up or interventions.  - Refill tylenol #3 (#30) x1 - Follow up with PCP - Also had question about mammogram (no sxs) which I will defer to PCP.

## 2015-10-15 ENCOUNTER — Other Ambulatory Visit: Payer: Self-pay | Admitting: Family Medicine

## 2015-10-15 ENCOUNTER — Telehealth: Payer: Self-pay | Admitting: Family Medicine

## 2015-10-15 DIAGNOSIS — F3170 Bipolar disorder, currently in remission, most recent episode unspecified: Secondary | ICD-10-CM

## 2015-10-15 MED ORDER — CLONAZEPAM 1 MG PO TABS: 1.0000 mg | ORAL_TABLET | Freq: Two times a day (BID) | ORAL | Status: DC

## 2015-10-15 MED ORDER — AMITRIPTYLINE HCL 50 MG PO TABS: 50.0000 mg | ORAL_TABLET | Freq: Every day | ORAL | Status: DC

## 2015-10-15 NOTE — Telephone Encounter (Signed)
Spoke to [employer] [facility] [employer].  She notes that patient's psychiatric provider Dr Lajuan Lines is out of town for the next month.  Because of the nature of their facility, there is no provider covering him during his absence.  She notes that he is prescribing Lithium, Amitriptyline 50mg  qhs, and Klonopin 1mg  BID to patient.  Will refill the requested medications x1 month while he is gone.  Please fax copy of this refill/ phone note to her Dr Lajuan Lines at [phone].

## 2015-10-15 NOTE — Telephone Encounter (Signed)
Spoke to pt. Provider info below [facility] Dr. Carin Primrose [phone]   Pt said thank you!

## 2015-10-15 NOTE — Telephone Encounter (Signed)
Pt called because her physiatrist is out of the office for 1 month. She has talked to the answering service and the Network engineer. She was told to call her PCP to get at least 1 month refill on her medications until the doctor returns. She is on Klonopin, and Amitriptyline. Please let patient know what the status will be. jw

## 2015-10-15 NOTE — Telephone Encounter (Signed)
Will do this after I talk to their office.  Can we call the patient to verify who she is seeing so that I can follow up with her provider?

## 2015-10-15 NOTE — Telephone Encounter (Signed)
Spoke to pt. Informed her the meds would be refilled for 1 month. Faxing the phone note to Dr. Lajuan Lines. Ottis Stain, CMA

## 2015-11-09 ENCOUNTER — Telehealth: Payer: Self-pay | Admitting: Family Medicine

## 2015-11-09 NOTE — Telephone Encounter (Signed)
Discussed with patient in September that Tylenol #3 was a SHORT term med. she was referred to [facility] for menstrual issues. Refill not appropriate. Additionally pt has a psychiatrist that handles her psych meds (elavil and klonopin). Please have her call her psychiatric provider for those meds.

## 2015-11-09 NOTE — Telephone Encounter (Signed)
Patient needs refill of medications:  Tylenol 3, klonopin, and elavil.  She uses [facility] on [address].

## 2015-11-09 NOTE — Telephone Encounter (Signed)
Gyn not [facility].

## 2015-11-12 ENCOUNTER — Other Ambulatory Visit: Payer: Self-pay | Admitting: Family Medicine

## 2015-11-12 DIAGNOSIS — F3170 Bipolar disorder, currently in remission, most recent episode unspecified: Secondary | ICD-10-CM

## 2015-11-12 MED ORDER — AMITRIPTYLINE HCL 50 MG PO TABS: 50.0000 mg | ORAL_TABLET | Freq: Every day | ORAL | Status: DC

## 2015-11-12 MED ORDER — CLONAZEPAM 1 MG PO TABS: 1.0000 mg | ORAL_TABLET | Freq: Two times a day (BID) | ORAL | Status: DC

## 2015-11-12 NOTE — Telephone Encounter (Signed)
Three week supply of Elavil and Klonopin sent.  Please call pharmacy to approve Klonopin.  No Tylenol #3.  Needs to be seen by gyn if ongoing gyn issues needing narcotics.

## 2015-11-12 NOTE — Telephone Encounter (Signed)
Spoke to pt. She is seeing new psychiatric provider on May 1st. Will be out of meds on the 13th. Would you give her enough to get her to the appt on May 1st?. Please advise. Pt also stated she does not want to go to GYN. Will just stay as is. Ottis Stain, CMA

## 2015-11-13 NOTE — Telephone Encounter (Signed)
LVM for pt to call the office. If she calls, please let her know a 3 week supply of Elavil and Klonopin has been sent to the pharmacy. Ottis Stain, CMA

## 2015-11-23 ENCOUNTER — Ambulatory Visit (INDEPENDENT_AMBULATORY_CARE_PROVIDER_SITE_OTHER): Payer: Medicaid Other | Admitting: Family Medicine

## 2015-11-23 ENCOUNTER — Encounter: Payer: Self-pay | Admitting: Family Medicine

## 2015-11-23 DIAGNOSIS — N946 Dysmenorrhea, unspecified: Secondary | ICD-10-CM | POA: Diagnosis not present

## 2015-11-23 MED ORDER — TRAMADOL HCL 50 MG PO TABS: 50.0000 mg | ORAL_TABLET | Freq: Three times a day (TID) | ORAL | Status: DC | PRN

## 2015-11-23 NOTE — Progress Notes (Signed)
Subjective:    Patient ID: Krystal West , female   DOB: 03/03/1971 , 45 y.o..   MRN: [id number]  HPI  Krystal West is here for dysmenorrhea.  Dysmenorrhea:  Patient is a G4P4 s/p tubal ligation who complains of very bad period pains for over a year.  She is currently on her period and unable to complete work at home because of the pain. Pain typically lasts the entire duration of her menses and a couple days afterwards. She states that she is manic depressive and gets stressed going to the doctor and gets angry when she feels pain.  She has been seen before for dysmennorhea, most recently by Dr. Bonner Puna on 09/24/15 and was given a short term dose of Tylenol #3.  Tylenol 3 has always helped her. Has tried Ibuprofen, naproxen, and OCPs all of which did not help. Aggravating factors: no identified. Patient has been referred to gynecology in the past but states she is scared to go in fear of what they will find and she doesn't want surgery. Has regular periods every month that last 10-12 days at time. Uses 7-8 maxi pads a day when menstruating.   No bleeding in between periods.   Review of Systems: Per HPI. All other systems reviewed and are negative.  Past Medical History: Patient Active Problem List   Diagnosis Date Noted  . Dysmenorrhea 09/24/2015  . Contraception 12/11/2010  . Chronic pelvic pain in female 05/04/2009  . Bipolar disorder ([place]) 10/01/2006    Medications: reviewed and updated Current Outpatient Prescriptions  Medication Sig Dispense Refill  . acetaminophen-codeine (TYLENOL #3) 300-30 MG tablet Take 1 tablet by mouth every 6 (six) hours as needed for moderate pain (severe menstrual pain). 30 tablet 0  . amitriptyline (ELAVIL) 50 MG tablet Take 1 tablet (50 mg total) by mouth at bedtime. 21 tablet 0  . clonazePAM (KLONOPIN) 1 MG tablet Take 1 tablet (1 mg total) by mouth 2 (two) times daily. 42 tablet 0  . fluticasone (FLONASE) 50 MCG/ACT nasal spray Place 2 sprays into  the nose daily. (Patient not taking: Reported on 04/10/2015) 16 g 6  . HYDROcodone-acetaminophen (NORCO/VICODIN) 5-325 MG per tablet 1 to 2 tabs every 4 to 6 hours as needed for pain. 20 tablet 0  . hydrocortisone (ANUSOL-HC) 2.5 % rectal cream Apply rectally 2 times daily 30 g 2  . Hydrocortisone Acetate (TUCKS ANTI-ITCH) 1 % OINT Apply topical as needed. 30 g 0  . lithium carbonate (ESKALITH) 450 MG CR tablet Take by mouth 2 (two) times daily.    . naproxen (NAPROSYN) 500 MG tablet Take 1 tablet (500 mg total) by mouth 2 (two) times daily. 30 tablet 0  . omeprazole (PRILOSEC) 20 MG capsule Take 1 capsule (20 mg total) by mouth daily. 30 capsule 3  . polyethylene glycol powder (GLYCOLAX/MIRALAX) powder Take 17 g by mouth daily as needed (Constipation.). 3350 g 1   No current facility-administered medications for this visit.    Social Hx:  reports that she has never smoked. She has never used smokeless tobacco.    Objective:   BP 116/89 mmHg  Pulse 89  Temp(Src) 98.7 F (37.1 C) (Oral)  Ht 5\' 6"  (1.676 m)  Wt 211 lb 3.2 oz (95.8 kg)  BMI 34.10 kg/m2  LMP 11/23/2015 Physical Exam  Gen: NAD, alert, cooperative with exam, well-appearing Cardiac: Regular rate and rhythm, normal S1/S2, no murmur, no edema, capillary refill brisk  Respiratory: Clear to auscultation bilaterally, no wheezes, non-labored  Abdomen: soft, non tender, non distended, bowel sounds present.   Assessment & Plan:  Dysmenorrhea G4P4 s/p tubal ligation with negative work up in 2010. Per patient report, has had unsuccessful treatment with NSAIDs and OCPs. Differentials include chronic pelvic pain, endometriosis, or uterine fibroids.  - Will not refill Tylenol 3 as this was intended to be short term medication per PCP until she could see gynecology. - Will refer to gynecology again, patient agreeable to make appointment and importance of seeing a gynecological specialist was reviewed. She expressed good understanding. -  Will give Ultram (#20) x 1 until patient can see gynecologist.

## 2015-11-23 NOTE — Patient Instructions (Signed)
Thank you for coming in today, it was so nice to see you!  Today we talked about your period pain.   You will need to see a gynecologist. I will give you a short temporary course of Ultram pain medication to last you until you see the gynecologist. I will put in a referral for you and someone should call you to schedule an appointment.   If you have any questions or concerns, please do not hesitate to call the office at [phone].  Sincerely,  Smitty Cords, MD   Dysmenorrhea Menstrual cramps (dysmenorrhea) are caused by the muscles of the uterus tightening (contracting) during a menstrual period. For some women, this discomfort is merely bothersome. For others, dysmenorrhea can be severe enough to interfere with everyday activities for a few days each month. Primary dysmenorrhea is menstrual cramps that last a couple of days when you start having menstrual periods or soon after. This often begins after a teenager starts having her period. As a woman gets older or has a baby, the cramps will usually lessen or disappear. Secondary dysmenorrhea begins later in life, lasts longer, and the pain may be stronger than primary dysmenorrhea. The pain may start before the period and last a few days after the period.  CAUSES  Dysmenorrhea is usually caused by an underlying problem, such as:  The tissue lining the uterus grows outside of the uterus in other areas of the body (endometriosis).  The endometrial tissue, which normally lines the uterus, is found in or grows into the muscular walls of the uterus (adenomyosis).  The pelvic blood vessels are engorged with blood just before the menstrual period (pelvic congestive syndrome).  Overgrowth of cells (polyps) in the lining of the uterus or cervix.  Falling down of the uterus (prolapse) because of loose or stretched ligaments.  Depression.  Bladder problems, infection, or inflammation.  Problems with the intestine, a tumor, or  irritable bowel syndrome.  Cancer of the female organs or bladder.  A severely tipped uterus.  A very tight opening or closed cervix.  Noncancerous tumors of the uterus (fibroids).  Pelvic inflammatory disease (PID).  Pelvic scarring (adhesions) from a previous surgery.  Ovarian cyst.  An intrauterine device (IUD) used for birth control. RISK FACTORS You may be at greater risk of dysmenorrhea if:  You are younger than age 56.  You started puberty early.  You have irregular or heavy bleeding.  You have never given birth.  You have a family history of this problem.  You are a smoker. SIGNS AND SYMPTOMS   Cramping or throbbing pain in your lower abdomen.  Headaches.  Lower back pain.  Nausea or vomiting.  Diarrhea.  Sweating or dizziness.  Loose stools. DIAGNOSIS  A diagnosis is based on your history, symptoms, physical exam, diagnostic tests, or procedures. Diagnostic tests or procedures may include:  Blood tests.  Ultrasonography.  An examination of the lining of the uterus (dilation and curettage, D&C).  An examination inside your abdomen or pelvis with a scope (laparoscopy).  X-rays.  CT scan.  MRI.  An examination inside the bladder with a scope (cystoscopy).  An examination inside the intestine or stomach with a scope (colonoscopy, gastroscopy). TREATMENT  Treatment depends on the cause of the dysmenorrhea. Treatment may include:  Pain medicine prescribed by your health care provider.  Birth control pills or an IUD with progesterone hormone in it.  Hormone replacement therapy.  Nonsteroidal anti-inflammatory drugs (NSAIDs). These may help stop the production of  prostaglandins.  Surgery to remove adhesions, endometriosis, ovarian cyst, or fibroids.  Removal of the uterus (hysterectomy).  Progesterone shots to stop the menstrual period.  Cutting the nerves on the sacrum that go to the female organs (presacral neurectomy).  Electric  current to the sacral nerves (sacral nerve stimulation).  Antidepressant medicine.  Psychiatric therapy, counseling, or group therapy.  Exercise and physical therapy.  Meditation and yoga therapy.  Acupuncture. HOME CARE INSTRUCTIONS   Only take over-the-counter or prescription medicines as directed by your health care provider.  Place a heating pad or hot water bottle on your lower back or abdomen. Do not sleep with the heating pad.  Use aerobic exercises, walking, swimming, biking, and other exercises to help lessen the cramping.  Massage to the lower back or abdomen may help.  Stop smoking.  Avoid alcohol and caffeine. SEEK MEDICAL CARE IF:   Your pain does not get better with medicine.  You have pain with sexual intercourse.  Your pain increases and is not controlled with medicines.  You have abnormal vaginal bleeding with your period.  You develop nausea or vomiting with your period that is not controlled with medicine. SEEK IMMEDIATE MEDICAL CARE IF:  You pass out.    This information is not intended to replace advice given to you by your health care provider. Make sure you discuss any questions you have with your health care provider.   Document Released: 07/21/2005 Document Revised: 03/23/2013 Document Reviewed: 01/06/2013 Elsevier Interactive Patient Education [employer].

## 2015-11-25 NOTE — Assessment & Plan Note (Signed)
G4P4 s/p tubal ligation with negative work up in 2010. Per patient report, has had unsuccessful treatment with NSAIDs and OCPs. Differentials include chronic pelvic pain, endometriosis, or uterine fibroids.  - Will not refill Tylenol 3 as this was intended to be short term medication per PCP until she could see gynecology. - Will refer to gynecology again, patient agreeable to make appointment and importance of seeing a gynecological specialist was reviewed. She expressed good understanding. - Will give Ultram (#20) x 1 until patient can see gynecologist.

## 2015-12-04 ENCOUNTER — Encounter (HOSPITAL_COMMUNITY): Payer: Self-pay | Admitting: Emergency Medicine

## 2015-12-04 ENCOUNTER — Emergency Department (HOSPITAL_COMMUNITY)
Admission: EM | Admit: 2015-12-04 | Discharge: 2015-12-04 | Disposition: A | Discharge status: Home / Self Care | Payer: Medicaid Other | Attending: Emergency Medicine | Admitting: Emergency Medicine

## 2015-12-04 ENCOUNTER — Emergency Department (HOSPITAL_COMMUNITY): Payer: Medicaid Other

## 2015-12-04 DIAGNOSIS — M25512 Pain in left shoulder: Secondary | ICD-10-CM | POA: Insufficient documentation

## 2015-12-04 DIAGNOSIS — Z3202 Encounter for pregnancy test, result negative: Secondary | ICD-10-CM | POA: Diagnosis not present

## 2015-12-04 DIAGNOSIS — R52 Pain, unspecified: Secondary | ICD-10-CM | POA: Diagnosis present

## 2015-12-04 DIAGNOSIS — R067 Sneezing: Secondary | ICD-10-CM | POA: Diagnosis not present

## 2015-12-04 DIAGNOSIS — R000 Tachycardia, unspecified: Secondary | ICD-10-CM | POA: Diagnosis not present

## 2015-12-04 DIAGNOSIS — R0981 Nasal congestion: Secondary | ICD-10-CM | POA: Insufficient documentation

## 2015-12-04 DIAGNOSIS — R112 Nausea with vomiting, unspecified: Secondary | ICD-10-CM | POA: Diagnosis not present

## 2015-12-04 DIAGNOSIS — Z79899 Other long term (current) drug therapy: Secondary | ICD-10-CM | POA: Diagnosis not present

## 2015-12-04 DIAGNOSIS — M546 Pain in thoracic spine: Secondary | ICD-10-CM | POA: Insufficient documentation

## 2015-12-04 DIAGNOSIS — J00 Acute nasopharyngitis [common cold]: Secondary | ICD-10-CM | POA: Insufficient documentation

## 2015-12-04 DIAGNOSIS — I10 Essential (primary) hypertension: Secondary | ICD-10-CM | POA: Diagnosis not present

## 2015-12-04 DIAGNOSIS — R251 Tremor, unspecified: Secondary | ICD-10-CM | POA: Insufficient documentation

## 2015-12-04 DIAGNOSIS — R509 Fever, unspecified: Secondary | ICD-10-CM | POA: Diagnosis not present

## 2015-12-04 DIAGNOSIS — Z7951 Long term (current) use of inhaled steroids: Secondary | ICD-10-CM | POA: Insufficient documentation

## 2015-12-04 DIAGNOSIS — H6121 Impacted cerumen, right ear: Secondary | ICD-10-CM | POA: Diagnosis not present

## 2015-12-04 DIAGNOSIS — R05 Cough: Secondary | ICD-10-CM | POA: Diagnosis not present

## 2015-12-04 DIAGNOSIS — F419 Anxiety disorder, unspecified: Secondary | ICD-10-CM | POA: Insufficient documentation

## 2015-12-04 DIAGNOSIS — Z7952 Long term (current) use of systemic steroids: Secondary | ICD-10-CM | POA: Insufficient documentation

## 2015-12-04 DIAGNOSIS — M25511 Pain in right shoulder: Secondary | ICD-10-CM | POA: Insufficient documentation

## 2015-12-04 DIAGNOSIS — M791 Myalgia, unspecified site: Secondary | ICD-10-CM

## 2015-12-04 HISTORY — DX: Essential (primary) hypertension: I10

## 2015-12-04 LAB — URINALYSIS, ROUTINE W REFLEX MICROSCOPIC
Glucose, UA: NEGATIVE mg/dL
Hgb urine dipstick: NEGATIVE
Ketones, ur: 15 mg/dL — AB
Nitrite: POSITIVE — AB
PH: 5.5 (ref 5.0–8.0)
Protein, ur: 30 mg/dL — AB
SPECIFIC GRAVITY, URINE: 1.035 — AB (ref 1.005–1.030)

## 2015-12-04 LAB — CBC WITH DIFFERENTIAL/PLATELET
Basophils Absolute: 0 10*3/uL (ref 0.0–0.1)
Basophils Relative: 1 %
EOS ABS: 0.2 10*3/uL (ref 0.0–0.7)
EOS PCT: 5 %
HEMATOCRIT: 40.5 % (ref 36.0–46.0)
HEMOGLOBIN: 13.6 g/dL (ref 12.0–15.0)
LYMPHS PCT: 23 %
Lymphs Abs: 1 10*3/uL (ref 0.7–4.0)
MCH: 30.3 pg (ref 26.0–34.0)
MCHC: 33.6 g/dL (ref 30.0–36.0)
MCV: 90.2 fL (ref 78.0–100.0)
Monocytes Absolute: 0.6 10*3/uL (ref 0.1–1.0)
Monocytes Relative: 14 %
NEUTROS ABS: 2.4 10*3/uL (ref 1.7–7.7)
NEUTROS PCT: 57 %
Platelets: 192 10*3/uL (ref 150–400)
RBC: 4.49 MIL/uL (ref 3.87–5.11)
RDW: 15 % (ref 11.5–15.5)
WBC: 4.3 10*3/uL (ref 4.0–10.5)

## 2015-12-04 LAB — BASIC METABOLIC PANEL
ANION GAP: 14 (ref 5–15)
BUN: 10 mg/dL (ref 6–20)
CHLORIDE: 99 mmol/L — AB (ref 101–111)
CO2: 25 mmol/L (ref 22–32)
CREATININE: 0.82 mg/dL (ref 0.44–1.00)
Calcium: 9.1 mg/dL (ref 8.9–10.3)
GFR calc Af Amer: >60 mL/min (ref >60)
GFR calc non Af Amer: >60 mL/min (ref >60)
Glucose, Bld: 103 mg/dL — ABNORMAL HIGH (ref 65–99)
Potassium: 3.3 mmol/L — ABNORMAL LOW (ref 3.5–5.1)
Sodium: 138 mmol/L (ref 135–145)

## 2015-12-04 LAB — URINE MICROSCOPIC-ADD ON

## 2015-12-04 LAB — POC URINE PREG, ED: Preg Test, Ur: NEGATIVE

## 2015-12-04 MED ORDER — ONDANSETRON 4 MG PO TBDP: 4.0000 mg | ORAL_TABLET | Freq: Three times a day (TID) | ORAL | Status: DC | PRN

## 2015-12-04 MED ORDER — NAPROXEN 500 MG PO TABS
500.0000 mg | ORAL_TABLET | Freq: Two times a day (BID) | ORAL | Status: DC
Start: 2015-12-04 — End: 2016-10-20

## 2015-12-04 MED ORDER — ONDANSETRON HCL 4 MG/2ML IJ SOLN
4.0000 mg | Freq: Once | INTRAMUSCULAR | Status: AC
  Administered 2015-12-04: 4 mg via INTRAVENOUS
  Filled 2015-12-04: qty 2

## 2015-12-04 MED ORDER — KETOROLAC TROMETHAMINE 30 MG/ML IJ SOLN
30.0000 mg | Freq: Once | INTRAMUSCULAR | Status: AC
  Administered 2015-12-04: 30 mg via INTRAVENOUS
  Filled 2015-12-04: qty 1

## 2015-12-04 MED ORDER — LORAZEPAM 2 MG/ML IJ SOLN
1.0000 mg | Freq: Once | INTRAMUSCULAR | Status: DC
  Filled 2015-12-04: qty 1

## 2015-12-04 MED ORDER — SODIUM CHLORIDE 0.9 % IV BOLUS (SEPSIS)
1000.0000 mL | Freq: Once | INTRAVENOUS | Status: AC
  Administered 2015-12-04: 1000 mL via INTRAVENOUS

## 2015-12-04 NOTE — Discharge Instructions (Signed)
Please read and follow all provided instructions.  Your diagnoses today include:  1. Myalgia     Tests performed today include:  Vital signs. See below for your results today.   Blood counts and electrolytes - normal  Urine test - no definite infection  Chest x-ray - normal  Medications prescribed:   Naproxen - anti-inflammatory pain medication  Do not exceed 500mg  naproxen every 12 hours, take with food  You have been prescribed an anti-inflammatory medication or NSAID. Take with food. Take smallest effective dose for the shortest duration needed for your pain. Stop taking if you experience stomach pain or vomiting.    Zofran (ondansetron) - for nausea and vomiting  Take any prescribed medications only as directed. Treatment for your infection is aimed at treating the symptoms. There are no medications, such as antibiotics, that will cure your infection.   Home care instructions:  Follow any educational materials contained in this packet.   Your illness is contagious and can be spread to others, especially during the first 3 or 4 days. It cannot be cured by antibiotics or other medicines. Take basic precautions such as washing your hands often, covering your mouth when you cough or sneeze, and avoiding public places where you could spread your illness to others.   Please continue drinking plenty of fluids.  Use over-the-counter medicines as needed as directed on packaging for symptom relief.  You may also use ibuprofen or tylenol as directed on packaging for pain or fever.  Do not take multiple medicines containing Tylenol or acetaminophen to avoid taking too much of this medication.  Follow-up instructions: Please follow-up with your primary care provider in the next 3 days for further evaluation of your symptoms if you are not feeling better.   Return instructions:   Please return to the Emergency Department if you experience worsening symptoms.   RETURN IMMEDIATELY IF  you develop shortness of breath, confusion or altered mental status, a new rash, become dizzy, faint, or poorly responsive, or are unable to be cared for at home.  Please return if you have persistent vomiting and cannot keep down fluids or develop a fever that is not controlled by tylenol or motrin.    Please return if you have any other emergent concerns.  Additional Information:  Your vital signs today were: BP 144/93 mmHg   Pulse 84   Temp(Src) 98.4 F (36.9 C) (Oral)   Resp 18   SpO2 100%   LMP 09/25/2015 If your blood pressure (BP) was elevated above 135/85 this visit, please have this repeated by your doctor within one month. --------------

## 2015-12-04 NOTE — ED Provider Notes (Signed)
10:17 AM Patient seen and examined. Reviewed note from [facility]. Patient is much more calm, HR 90. She presents like flu-like illness. Low concern for EtOH withdrawal. Labs and CXR ordered, pending. Will re-eval after toradol, zofran, fluids.   11:44 AM Labs reviewed with patient. She is feeling better but has some middle back aches. She denies any current medications or UTI sx. Urine culture sent 2/2 + nitrite but will not treat unless this was positive.   BP 144/93 mmHg  Pulse 84  Temp(Src) 98.4 F (36.9 C) (Oral)  Resp 18  SpO2 100%  LMP 09/25/2015  Patient is requesting to go home. Encouraged return to the emergency department with worsening of current symptoms, high persistent fever, persistent vomiting, chest pain or shortness of breath, or other concerns. She verbalizes understanding and agrees with plan. Will discharge to home with naproxen and Zofran.  Carlisle Cater, PA-C 12/04/15 1146

## 2015-12-04 NOTE — ED Notes (Signed)
Pts only phone # [phone], this nurse called registration to get records changed.  Other phone numbers are not valid.

## 2015-12-04 NOTE — ED Notes (Signed)
Woke this am "shaking", bodyaches, fever.

## 2015-12-04 NOTE — ED Provider Notes (Signed)
CSN: [id number]     Arrival date & time 12/04/15  0909 History  By signing my name below, I, Eustaquio Maize, attest that this documentation has been prepared under the direction and in the presence of Wendie Simmer, PA-C. Electronically Signed: Eustaquio Maize, ED Scribe. 12/04/2015. 9:49 AM.   Chief Complaint  Patient presents with  . Fever  . Cough   The history is provided by the patient. No language interpreter was used.     HPI Comments: Krystal West is a 45 y.o. female who presents to the Emergency Department complaining of gradual onset, constant, cold like symptoms including cough, chills, sneezing, and nasal congestion x 2 days. Pt also complains of body aches, nausea, vomiting x 2 episodes in the last 2 days, bilateral shoulder pain, and back pain. She states she has been eating and drinking normally. Pt has had recent sick contact with similar symptoms. Patient is a smoker. No hx IVDA, illicit drug use. Pt does report drinking daily and states she drank a "normal amount" last night. Denies fever, dysuria, or any other associated symptoms. LNMP: 2 months ago.   Past Medical History  Diagnosis Date  . Hypertension    Past Surgical History  Procedure Laterality Date  . Tubal ligation  1995   Family History  Problem Relation Age of Onset  . Hypertension Mother   . Hypertension Father   . Heart disease Father   . Schizophrenia Brother   . Multiple sclerosis Brother   . Cancer Maternal Grandmother     breast ca  . Stroke Neg Hx   . Diabetes Neg Hx   . Schizophrenia Brother    Social History  Substance Use Topics  . Smoking status: Never Smoker   . Smokeless tobacco: Never Used  . Alcohol Use: 0.0 oz/week    1-2 Shots of liquor per week   OB History    Gravida Para Term Preterm AB TAB SAB Ectopic Multiple Living   4 4 4   0 0 0 0 0 4     Review of Systems A complete 10 system review of systems was obtained and all systems are negative except as noted in the HPI and PMH.    Allergies  Review of patient's allergies indicates no known allergies.  Home Medications   Prior to Admission medications   Medication Sig Start Date End Date Taking? Authorizing Provider  acetaminophen-codeine (TYLENOL #3) 300-30 MG tablet Take 1 tablet by mouth every 6 (six) hours as needed for moderate pain (severe menstrual pain). 09/24/15   Patrecia Pour, MD  amitriptyline (ELAVIL) 50 MG tablet Take 1 tablet (50 mg total) by mouth at bedtime. 11/12/15   Ashly Windell Moulding, DO  clonazePAM (KLONOPIN) 1 MG tablet Take 1 tablet (1 mg total) by mouth 2 (two) times daily. 11/12/15   Ashly Windell Moulding, DO  fluticasone (FLONASE) 50 MCG/ACT nasal spray Place 2 sprays into the nose daily. Patient not taking: Reported on 04/10/2015 07/08/12   Cletus Gash, MD  HYDROcodone-acetaminophen (NORCO/VICODIN) 5-325 MG per tablet 1 to 2 tabs every 4 to 6 hours as needed for pain. 01/10/14   Harden Mo, MD  hydrocortisone (ANUSOL-HC) 2.5 % rectal cream Apply rectally 2 times daily 04/10/15   Janora Norlander, DO  Hydrocortisone Acetate (TUCKS ANTI-ITCH) 1 % OINT Apply topical as needed. 12/22/13   Coral Spikes, DO  lithium carbonate (ESKALITH) 450 MG CR tablet Take by mouth 2 (two) times daily.    Historical Provider,  MD  naproxen (NAPROSYN) 500 MG tablet Take 1 tablet (500 mg total) by mouth 2 (two) times daily. 08/14/15   Gloriann Loan, PA-C  omeprazole (PRILOSEC) 20 MG capsule Take 1 capsule (20 mg total) by mouth daily. 06/01/15   Sela Hua, MD  polyethylene glycol powder (GLYCOLAX/MIRALAX) powder Take 17 g by mouth daily as needed (Constipation.). 05/11/13   Coral Spikes, DO  traMADol (ULTRAM) 50 MG tablet Take 1 tablet (50 mg total) by mouth every 8 (eight) hours as needed for severe pain. 11/23/15   Carlyle Dolly, MD   BP 152/102 mmHg  Pulse 114  Temp(Src) 98.7 F (37.1 C) (Oral)  Resp 20  SpO2 99%  LMP 09/25/2015   Physical Exam  Constitutional: She is oriented to person, place, and  time. She appears well-developed and well-nourished.  Anxious appearing and shaky.   HENT:  Head: Normocephalic and atraumatic.  Right Ear: External ear normal.  Left Ear: External ear normal.  Mouth/Throat: Posterior oropharyngeal erythema (slight) present. No oropharyngeal exudate.  Right ear has some cerumen but both TMs are normal in appearance.  No tonsillar exudate. Slight oropharynx erythema  Eyes: Conjunctivae and EOM are normal.  Neck: Neck supple. No tracheal deviation present.  Cardiovascular: Regular rhythm.  Tachycardia present.   Pulmonary/Chest: Effort normal and breath sounds normal. No respiratory distress. She has no wheezes. She has no rales.  Abdominal: Soft. There is no tenderness.  Musculoskeletal: Normal range of motion. She exhibits tenderness.  Diffuse tenderness to back  Neurological: She is alert and oriented to person, place, and time.  Skin: Skin is warm and dry.  Psychiatric: She has a normal mood and affect. Her behavior is normal.  Nursing note and vitals reviewed.   ED Course  Procedures   DIAGNOSTIC STUDIES: Oxygen Saturation is 99% on RA, normal by my interpretation.    COORDINATION OF CARE: 9:48 AM-Discussed treatment plan with pt at bedside and pt agreed to plan.   Labs Review Labs Reviewed  CBC WITH DIFFERENTIAL/PLATELET  BASIC METABOLIC PANEL  URINALYSIS, ROUTINE W REFLEX MICROSCOPIC (NOT AT [facility])  POC URINE PREG, ED     MDM   Final diagnoses:  None   Patient tachycardic, shaking on exam. Most likely etiology based on patient history and physical exam is infectious etiology versus anxiety. Patient denied history of drug use; however, per RN's record review, patient has a history of cocaine and marijuana use, although I have not found record of this myself. Patient was in fast track but will be moved to a high level acuity area based on tachycardia, shakiness, and my clinical judgment. Basic labs, UA, urine pregnancy, CXR, IV fluid  bolus, and 1 mg ativan were ordered prior to transfer to higher acuity area. Patient is stable, and Dr. Gilford Raid made aware of patient.   I personally performed the services described in this documentation, which was scribed in my presence. The recorded information has been reviewed and is accurate.   Snyder Lions, PA-C 12/04/15 [place], MD 12/04/15 [phone]

## 2015-12-06 LAB — URINE CULTURE

## 2015-12-07 ENCOUNTER — Ambulatory Visit: Payer: Medicaid Other | Admitting: Family Medicine

## 2015-12-19 ENCOUNTER — Telehealth: Payer: Self-pay | Admitting: Family Medicine

## 2015-12-19 NOTE — Telephone Encounter (Signed)
Krystal West need a refill on her tramadol for menstrual cramps.

## 2015-12-19 NOTE — Telephone Encounter (Signed)
Denied. She was seen by Dr Juanito Doom recently and given short supply to get her through until she saw gynecology. Chronic pain medication NOT appropriate for menstrual cramps. She has been told this on multiple occasions. Please have her see gyn.

## 2015-12-20 NOTE — Telephone Encounter (Signed)
Voicemail box not set up. If pt calls, please give her info below. Ottis Stain, CMA

## 2015-12-27 NOTE — Telephone Encounter (Signed)
LM with female to have pt return call to clinic to inform her of below. Katharina Caper, Marisa Hufstetler D, [place]

## 2016-01-02 NOTE — Telephone Encounter (Signed)
Tried to contact pt and VM has not been set up yet, trying to inform pt of below if she calls back. Krystal West, Krystal West, [place]

## 2016-01-25 ENCOUNTER — Encounter: Payer: Self-pay | Admitting: Obstetrics & Gynecology

## 2016-01-25 ENCOUNTER — Ambulatory Visit (INDEPENDENT_AMBULATORY_CARE_PROVIDER_SITE_OTHER): Payer: Medicaid Other | Admitting: Obstetrics & Gynecology

## 2016-01-25 ENCOUNTER — Telehealth: Payer: Self-pay | Admitting: *Deleted

## 2016-01-25 VITALS — BP 112/69 | HR 87 | Ht 66.0 in | Wt 213.2 lb

## 2016-01-25 DIAGNOSIS — N946 Dysmenorrhea, unspecified: Secondary | ICD-10-CM | POA: Diagnosis not present

## 2016-01-25 NOTE — Progress Notes (Signed)
   Subjective:    Patient ID: Krystal West, female    DOB: [birth year], 45 y.o.   MRN: [id number]  HPI 45 yo engaged AA P4 (90, 92, 67, and 47 yo kids) here with the issue of pain with periods for "years". She has been prescribed IBU, tramadol and tylenol #3. She has not had a recent gyn u/s and no laparoscopy ever.   Her pain starts a few days prior to menses, lasts during her period (10-12 days), and then lasts for a few days after.  She had a normal pap 1/15 and a u/s 9/15 that showed a 12 cm uterus and 2 very small fibroids. Recently her hbg was 13.6.  Review of Systems She had a BTL but then it failed and that is how her 22 yo daughter was born). She is not sexually active for the last 2 years. On disability for psych issues    Objective:   Physical Exam [id number] Breathing, conversing, and ambulating normally Abd- benign Bimanual exam reveals a 10 week size minimally mobile uterus, non-enlarged adnexa      Assessment & Plan:  Dysmenorrhea- schedule gyn u/s Rec laparoscopy (sent [place] an email to schedule this). She does not want her tubes retied while I'm there. She is aware that this office has a policy not prescribing narcotics.

## 2016-01-25 NOTE — Telephone Encounter (Signed)
Patient states she has bad uterine cramping and was told that she could not receive any pain medications without seeing gynecologist. Patient states that she saw gynecologist today (note in [place]) but her surgery wont be until sometime in September and she wants to know if PCP can prescribe something for her pain now.

## 2016-01-25 NOTE — Telephone Encounter (Signed)
Please see telephone note from 5/17.  Narcotic medications are NOT appropriate treatment for menstrual cramps.  Gynecology is working with patient.  Please instruct her to use Motrin or Aleve as directed on bottle for menstrual related pains.

## 2016-01-25 NOTE — Patient Instructions (Signed)
Endometriosis Endometriosis is a condition in which the tissue that lines the uterus (endometrium) grows outside of its normal location. The tissue may grow in many locations close to the uterus, but it commonly grows on the ovaries, fallopian tubes, vagina, or bowel. Because the uterus expels, or sheds, its lining every menstrual cycle, there is bleeding wherever the endometrial tissue is located. This can cause pain because blood is irritating to tissues not normally exposed to it.  CAUSES  The cause of endometriosis is not known.  SIGNS AND SYMPTOMS  Often, there are no symptoms. When symptoms are present, they can vary with the location of the displaced tissue. Various symptoms can occur at different times. Although symptoms occur mainly during a woman's menstrual period, they can also occur midcycle and usually stop with menopause. Some people may go months with no symptoms at all. Symptoms may include:   Back or abdominal pain.   Heavier bleeding during periods.   Pain during intercourse.   Painful bowel movements.   Infertility. DIAGNOSIS  Your health care provider will do a physical exam and ask about your symptoms. Various tests may be done, such as:   Blood tests and urine tests. These are done to help rule out other problems.   Ultrasound. This test is done to look for abnormal tissue.   An X-ray of the lower bowel (barium enema).  Laparoscopy. In this procedure, a thin, lighted tube with a tiny camera on the end (laparoscope) is inserted into your abdomen. This helps your health care provider look for abnormal tissue to confirm the diagnosis. The health care provider may also remove a small piece of tissue (biopsy) from any abnormal tissue found. This tissue sample can then be sent to a lab so it can be looked at under a microscope. TREATMENT  Treatment will vary and may include:   Medicines to relieve pain. Nonsteroidal anti-inflammatory drugs (NSAIDs) are a type of  pain medicine that can help to relieve the pain caused by endometriosis.  Hormonal therapy. When using hormonal therapy, periods are eliminated. This eliminates the monthly exposure to blood by the displaced endometrial tissue.   Surgery. Surgery may sometimes be done to remove the abnormal endometrial tissue. In severe cases, surgery may be done to remove the fallopian tubes, uterus, and ovaries (hysterectomy). HOME CARE INSTRUCTIONS   Take all medicines as directed by your health care provider. Do not take aspirin because it may increase bleeding when you are not on hormonal therapy.   Avoid activities that produce pain, including sexual activity. SEEK MEDICAL CARE IF:  You have pelvic pain before, after, or during your periods.  You have pelvic pain between periods that gets worse during your period.  You have pelvic pain during or after sex.  You have pelvic pain with bowel movements or urination, especially during your period.  You have problems getting pregnant.  You have a fever. SEEK IMMEDIATE MEDICAL CARE IF:   Your pain is severe and is not responding to pain medicine.   You have severe nausea and vomiting, or you cannot keep foods down.   You have pain that is limited to the right lower part of your abdomen.   You have swelling or increasing pain in your abdomen.   You see blood in your stool.  MAKE SURE YOU:   Understand these instructions.  Will watch your condition.  Will get help right away if you are not doing well or get worse.   This information   is not intended to replace advice given to you by your health care provider. Make sure you discuss any questions you have with your health care provider.   Document Released: 07/18/2000 Document Revised: 08/11/2014 Document Reviewed: 03/18/2013 Elsevier Interactive Patient Education 2016 Elsevier Inc.  

## 2016-01-28 NOTE — Telephone Encounter (Signed)
LVM for pt to call. Tiffny T Rishita Petron, CMA  

## 2016-02-04 ENCOUNTER — Encounter (HOSPITAL_COMMUNITY): Payer: Self-pay | Admitting: *Deleted

## 2016-02-11 ENCOUNTER — Other Ambulatory Visit: Payer: Self-pay | Admitting: Internal Medicine

## 2016-02-15 ENCOUNTER — Ambulatory Visit (INDEPENDENT_AMBULATORY_CARE_PROVIDER_SITE_OTHER): Payer: Medicaid Other | Admitting: Internal Medicine

## 2016-02-15 ENCOUNTER — Encounter: Payer: Self-pay | Admitting: Internal Medicine

## 2016-02-15 VITALS — BP 110/71 | HR 75 | Temp 98.3°F | Ht 66.0 in | Wt 214.4 lb

## 2016-02-15 DIAGNOSIS — N946 Dysmenorrhea, unspecified: Secondary | ICD-10-CM | POA: Diagnosis not present

## 2016-02-15 MED ORDER — KETOROLAC TROMETHAMINE 60 MG/2ML IM SOLN
60.0000 mg | Freq: Once | INTRAMUSCULAR | Status: AC
  Administered 2016-02-15: 60 mg via INTRAMUSCULAR

## 2016-02-15 MED ORDER — ACETAMINOPHEN-CODEINE #3 300-30 MG PO TABS: 1.0000 | ORAL_TABLET | Freq: Four times a day (QID) | ORAL | Status: DC | PRN

## 2016-02-15 NOTE — Patient Instructions (Signed)
Do not take any NSAIDs today after receiving the Toradol shot. We need to protect your kidneys.   Hope you feel better and that the surgery helps.   Take Care,   Dr. Juleen China

## 2016-02-15 NOTE — Telephone Encounter (Signed)
Pt calls back for answer.  Explained the below she is not happy with this as she has "tried every OTC medication), offered appointment for Monday (none available this pm).  She states no the cramps will be over by then. Fleeger, Krystal West, CMA

## 2016-02-15 NOTE — Progress Notes (Signed)
   Subjective:    Genifer Darney - 45 y.o. female MRN [id number]  Date of birth: June 17, 1971  HPI  Tahnia Banka is here for SDA for menstrual cramps.  Menstrual Cramps: Has long standing history of dysmenorrhea. Has been seen by GYN who is planning on laparoscopy in Aug 1. Has tried OTC medications without relief as well as several prescription NSAIDs. OCPs have also not helped. Pain started a few days ago and menses started yesterday. Has diffuse intense, cramping abdominal pain. Says pain usually lasts throughout the time of menses and then for a few days after. Periods typically last 10-11 days. Periods are regular and she denies bleeding between periods. Using heating pads to the area without relief. Tylenol #3 has worked well for her in the past.    -  reports that she has never smoked. She has never used smokeless tobacco. - Review of Systems: Per HPI. - Past Medical History: Patient Active Problem List   Diagnosis Date Noted  . Dysmenorrhea 09/24/2015  . Contraception 12/11/2010  . Chronic pelvic pain in female 05/04/2009  . Bipolar disorder ([place]) 10/01/2006   - Medications: reviewed and updated    Objective:   Physical Exam BP 110/71 mmHg  Pulse 75  Temp(Src) 98.3 F (36.8 C) (Oral)  Ht 5\' 6"  (1.676 m)  Wt 214 lb 6.4 oz (97.251 kg)  BMI 34.62 kg/m2  LMP 02/14/2016 (Exact Date) Gen: NAD, alert, cooperative with exam, well-appearing, tearful due to pain  HEENT: NCAT, PERRL, clear conjunctiva, oropharynx clear, supple neck CV: RRR, good S1/S2, no murmur, no edema, capillary refill brisk  Resp: CTABL, no wheezes, non-labored Abd: SNTND, BS present, no guarding or organomegaly    Assessment & Plan:   Dysmenorrhea Persistent, chronic problem now followed by GYN. In acute pain at presentation.  -Toradol 60 mg given, instructed to avoid other NSAIDs for the next 24 hours  -resume NSAIDs tomorrow and continue heating pads  -GYN planning for laparoscopy in Aug  -short term  course of Tylenol #3 given for pain control this menstrual cycle until surgery      Phill Myron, D.O. 02/15/2016, 4:23 PM PGY-2, [place]

## 2016-02-15 NOTE — Assessment & Plan Note (Signed)
Persistent, chronic problem now followed by GYN. In acute pain at presentation.  -Toradol 60 mg given, instructed to avoid other NSAIDs for the next 24 hours  -resume NSAIDs tomorrow and continue heating pads  -GYN planning for laparoscopy in Aug  -short term course of Tylenol #3 given for pain control this menstrual cycle until surgery

## 2016-02-18 NOTE — Patient Instructions (Addendum)
Your procedure is scheduled on:  Tuesday, March 04, 2016  Enter through the [employer] [facility] at:  9:45 AM  Pick up the phone at the desk and dial [phone].  Call this number if you have problems the morning of surgery: [phone].  Remember: Do NOT eat food or drink after:  Midnight Monday, June 31, 2017  Take these medicines the morning of surgery with a SIP OF WATER:  Omeprazole,   Do NOT wear jewelry (body piercing), metal hair clips/bobby pins, make-up, or nail polish. Do NOT wear lotions, powders, or perfumes.  You may wear deodorant. Do NOT shave for 48 hours prior to surgery. Do NOT bring valuables to the hospital. Contacts, dentures, or bridgework may not be worn into surgery.  Have a responsible adult drive you home and stay with you for 24 hours after your procedure

## 2016-02-19 ENCOUNTER — Encounter (HOSPITAL_COMMUNITY): Payer: Self-pay

## 2016-02-19 ENCOUNTER — Encounter (HOSPITAL_COMMUNITY)
Admission: RE | Admit: 2016-02-19 | Discharge: 2016-02-19 | Disposition: A | Discharge status: Home / Self Care | Payer: Medicaid Other | Source: Ambulatory Visit | Attending: Obstetrics & Gynecology | Admitting: Obstetrics & Gynecology

## 2016-02-19 DIAGNOSIS — N946 Dysmenorrhea, unspecified: Secondary | ICD-10-CM | POA: Insufficient documentation

## 2016-02-19 DIAGNOSIS — Z01812 Encounter for preprocedural laboratory examination: Secondary | ICD-10-CM | POA: Insufficient documentation

## 2016-02-19 HISTORY — DX: Cardiac arrhythmia, unspecified: I49.9

## 2016-02-19 HISTORY — DX: Enthesopathy, unspecified: M77.9

## 2016-02-19 HISTORY — DX: Gastro-esophageal reflux disease without esophagitis: K21.9

## 2016-02-19 LAB — CBC
HCT: 34.6 % — ABNORMAL LOW (ref 36.0–46.0)
Hemoglobin: 11.9 g/dL — ABNORMAL LOW (ref 12.0–15.0)
MCH: 29.6 pg (ref 26.0–34.0)
MCHC: 34.4 g/dL (ref 30.0–36.0)
MCV: 86.1 fL (ref 78.0–100.0)
PLATELETS: 300 10*3/uL (ref 150–400)
RBC: 4.02 MIL/uL (ref 3.87–5.11)
RDW: 14.9 % (ref 11.5–15.5)
WBC: 4.4 10*3/uL (ref 4.0–10.5)

## 2016-03-04 ENCOUNTER — Encounter (HOSPITAL_COMMUNITY): Payer: Self-pay | Admitting: *Deleted

## 2016-03-04 ENCOUNTER — Ambulatory Visit (HOSPITAL_COMMUNITY): Payer: Medicaid Other | Admitting: Anesthesiology

## 2016-03-04 ENCOUNTER — Other Ambulatory Visit: Payer: Self-pay | Admitting: Family Medicine

## 2016-03-04 ENCOUNTER — Ambulatory Visit (HOSPITAL_COMMUNITY)
Admission: RE | Admit: 2016-03-04 | Discharge: 2016-03-04 | Disposition: A | Discharge status: Home / Self Care | Payer: Medicaid Other | Source: Ambulatory Visit | Attending: Obstetrics & Gynecology | Admitting: Obstetrics & Gynecology

## 2016-03-04 ENCOUNTER — Encounter (HOSPITAL_COMMUNITY)
Admission: RE | Disposition: A | Discharge status: Home / Self Care | Payer: Self-pay | Source: Ambulatory Visit | Attending: Obstetrics & Gynecology

## 2016-03-04 DIAGNOSIS — N739 Female pelvic inflammatory disease, unspecified: Secondary | ICD-10-CM | POA: Diagnosis not present

## 2016-03-04 DIAGNOSIS — N946 Dysmenorrhea, unspecified: Secondary | ICD-10-CM | POA: Diagnosis present

## 2016-03-04 DIAGNOSIS — K219 Gastro-esophageal reflux disease without esophagitis: Secondary | ICD-10-CM | POA: Insufficient documentation

## 2016-03-04 HISTORY — DX: Major depressive disorder, single episode, unspecified: F32.9

## 2016-03-04 HISTORY — DX: Anxiety disorder, unspecified: F41.9

## 2016-03-04 HISTORY — DX: Post-traumatic stress disorder, unspecified: F43.10

## 2016-03-04 HISTORY — DX: Bipolar disorder, unspecified: F31.9

## 2016-03-04 HISTORY — PX: LAPAROSCOPY: SHX197

## 2016-03-04 LAB — PREGNANCY, URINE: Preg Test, Ur: NEGATIVE

## 2016-03-04 SURGERY — LAPAROSCOPY, DIAGNOSTIC
Anesthesia: General | Site: Abdomen

## 2016-03-04 MED ORDER — SUGAMMADEX SODIUM 200 MG/2ML IV SOLN
INTRAVENOUS | Status: AC
  Filled 2016-03-04: qty 2

## 2016-03-04 MED ORDER — SCOPOLAMINE 1 MG/3DAYS TD PT72
MEDICATED_PATCH | TRANSDERMAL | Status: AC
  Administered 2016-03-04: 1.5 mg via TRANSDERMAL
  Filled 2016-03-04: qty 1

## 2016-03-04 MED ORDER — MEPERIDINE HCL 25 MG/ML IJ SOLN: 6.2500 mg | INTRAMUSCULAR | Status: DC | PRN

## 2016-03-04 MED ORDER — MIDAZOLAM HCL 2 MG/2ML IJ SOLN
INTRAMUSCULAR | Status: DC | PRN
  Administered 2016-03-04: 2 mg via INTRAVENOUS

## 2016-03-04 MED ORDER — KETOROLAC TROMETHAMINE 30 MG/ML IJ SOLN
INTRAMUSCULAR | Status: AC
  Filled 2016-03-04: qty 1

## 2016-03-04 MED ORDER — FENTANYL CITRATE (PF) 100 MCG/2ML IJ SOLN
25.0000 ug | INTRAMUSCULAR | Status: DC | PRN
  Administered 2016-03-04: 50 ug via INTRAVENOUS

## 2016-03-04 MED ORDER — LACTATED RINGERS IV SOLN: INTRAVENOUS | Status: DC

## 2016-03-04 MED ORDER — OXYCODONE-ACETAMINOPHEN 5-325 MG PO TABS
1.0000 | ORAL_TABLET | Freq: Four times a day (QID) | ORAL | Status: DC | PRN
  Administered 2016-03-04: 1 via ORAL

## 2016-03-04 MED ORDER — OXYCODONE-ACETAMINOPHEN 5-325 MG PO TABS
ORAL_TABLET | ORAL | Status: AC
  Filled 2016-03-04: qty 1

## 2016-03-04 MED ORDER — MIDAZOLAM HCL 2 MG/2ML IJ SOLN
INTRAMUSCULAR | Status: AC
  Filled 2016-03-04: qty 2

## 2016-03-04 MED ORDER — PROPOFOL 10 MG/ML IV BOLUS
INTRAVENOUS | Status: AC
  Filled 2016-03-04: qty 20

## 2016-03-04 MED ORDER — ONDANSETRON HCL 4 MG/2ML IJ SOLN
INTRAMUSCULAR | Status: DC | PRN
  Administered 2016-03-04: 4 mg via INTRAVENOUS

## 2016-03-04 MED ORDER — LIDOCAINE HCL (CARDIAC) 20 MG/ML IV SOLN
INTRAVENOUS | Status: DC | PRN
  Administered 2016-03-04: 60 mg via INTRAVENOUS

## 2016-03-04 MED ORDER — LACTATED RINGERS IV SOLN
Freq: Once | INTRAVENOUS | Status: AC
  Administered 2016-03-04: 10:00:00 via INTRAVENOUS

## 2016-03-04 MED ORDER — DEXAMETHASONE SODIUM PHOSPHATE 4 MG/ML IJ SOLN
INTRAMUSCULAR | Status: AC
  Filled 2016-03-04: qty 1

## 2016-03-04 MED ORDER — ONDANSETRON HCL 4 MG/2ML IJ SOLN: 4.0000 mg | Freq: Once | INTRAMUSCULAR | Status: DC | PRN

## 2016-03-04 MED ORDER — LACTATED RINGERS IV SOLN
INTRAVENOUS | Status: DC | PRN
  Administered 2016-03-04: 10:00:00 via INTRAVENOUS

## 2016-03-04 MED ORDER — FENTANYL CITRATE (PF) 100 MCG/2ML IJ SOLN
INTRAMUSCULAR | Status: AC
  Filled 2016-03-04: qty 2

## 2016-03-04 MED ORDER — PROPOFOL 10 MG/ML IV BOLUS
INTRAVENOUS | Status: DC | PRN
  Administered 2016-03-04: 200 mg via INTRAVENOUS

## 2016-03-04 MED ORDER — ONDANSETRON HCL 4 MG/2ML IJ SOLN
INTRAMUSCULAR | Status: AC
  Filled 2016-03-04: qty 2

## 2016-03-04 MED ORDER — BUPIVACAINE HCL (PF) 0.5 % IJ SOLN
INTRAMUSCULAR | Status: AC
  Filled 2016-03-04: qty 30

## 2016-03-04 MED ORDER — SCOPOLAMINE 1 MG/3DAYS TD PT72
1.0000 | MEDICATED_PATCH | Freq: Once | TRANSDERMAL | Status: DC
  Administered 2016-03-04: 1.5 mg via TRANSDERMAL

## 2016-03-04 MED ORDER — FENTANYL CITRATE (PF) 250 MCG/5ML IJ SOLN
INTRAMUSCULAR | Status: AC
  Filled 2016-03-04: qty 5

## 2016-03-04 MED ORDER — DEXAMETHASONE SODIUM PHOSPHATE 10 MG/ML IJ SOLN
INTRAMUSCULAR | Status: DC | PRN
  Administered 2016-03-04: 4 mg via INTRAVENOUS

## 2016-03-04 MED ORDER — FENTANYL CITRATE (PF) 100 MCG/2ML IJ SOLN
INTRAMUSCULAR | Status: DC | PRN
  Administered 2016-03-04: 100 ug via INTRAVENOUS
  Administered 2016-03-04: 150 ug via INTRAVENOUS

## 2016-03-04 MED ORDER — SUGAMMADEX SODIUM 200 MG/2ML IV SOLN
INTRAVENOUS | Status: DC | PRN
  Administered 2016-03-04: 200 mg via INTRAVENOUS

## 2016-03-04 MED ORDER — LIDOCAINE HCL (CARDIAC) 20 MG/ML IV SOLN
INTRAVENOUS | Status: AC
  Filled 2016-03-04: qty 5

## 2016-03-04 MED ORDER — ROCURONIUM BROMIDE 100 MG/10ML IV SOLN
INTRAVENOUS | Status: DC | PRN
  Administered 2016-03-04: 10 mg via INTRAVENOUS
  Administered 2016-03-04: 40 mg via INTRAVENOUS

## 2016-03-04 MED ORDER — KETOROLAC TROMETHAMINE 30 MG/ML IJ SOLN: 30.0000 mg | Freq: Once | INTRAMUSCULAR | Status: DC

## 2016-03-04 MED ORDER — KETOROLAC TROMETHAMINE 30 MG/ML IJ SOLN
INTRAMUSCULAR | Status: DC | PRN
  Administered 2016-03-04: 30 mg via INTRAVENOUS

## 2016-03-04 MED ORDER — BUPIVACAINE HCL (PF) 0.5 % IJ SOLN
INTRAMUSCULAR | Status: DC | PRN
Start: 2016-03-04 — End: 2016-03-04
  Administered 2016-03-04: 15 mL

## 2016-03-04 SURGICAL SUPPLY — 34 items
APPLICATOR COTTON TIP 6IN STRL (MISCELLANEOUS) ×3 IMPLANT
BAG SPEC RTRVL LRG 6X4 10 (ENDOMECHANICALS)
CABLE HIGH FREQUENCY MONO STRZ (ELECTRODE) IMPLANT
CATH ROBINSON RED A/P 16FR (CATHETERS) ×3 IMPLANT
CLOSURE WOUND 1/2 X4 (GAUZE/BANDAGES/DRESSINGS) ×1
CLOTH BEACON ORANGE TIMEOUT ST (SAFETY) ×3 IMPLANT
DRSG COVADERM PLUS 2X2 (GAUZE/BANDAGES/DRESSINGS) ×6 IMPLANT
DRSG OPSITE POSTOP 3X4 (GAUZE/BANDAGES/DRESSINGS) IMPLANT
DURAPREP 26ML APPLICATOR (WOUND CARE) ×6 IMPLANT
ELECT REM PT RETURN 9FT ADLT (ELECTROSURGICAL)
ELECTRODE REM PT RTRN 9FT ADLT (ELECTROSURGICAL) IMPLANT
GLOVE BIO SURGEON STRL SZ 6.5 (GLOVE) ×2 IMPLANT
GLOVE BIO SURGEONS STRL SZ 6.5 (GLOVE) ×1
GLOVE BIOGEL PI IND STRL 7.0 (GLOVE) ×1 IMPLANT
GLOVE BIOGEL PI INDICATOR 7.0 (GLOVE) ×2
GOWN STRL REUS W/TWL LRG LVL3 (GOWN DISPOSABLE) ×6 IMPLANT
NDL SAFETY ECLIPSE 18X1.5 (NEEDLE) ×1 IMPLANT
NEEDLE HYPO 18GX1.5 SHARP (NEEDLE) ×3
NEEDLE INSUFFLATION 120MM (ENDOMECHANICALS) ×3 IMPLANT
NS IRRIG 1000ML POUR BTL (IV SOLUTION) ×3 IMPLANT
PACK LAPAROSCOPY BASIN (CUSTOM PROCEDURE TRAY) ×3 IMPLANT
PAD TRENDELENBURG POSITION (MISCELLANEOUS) ×3 IMPLANT
POUCH SPECIMEN RETRIEVAL 10MM (ENDOMECHANICALS) IMPLANT
SET IRRIG TUBING LAPAROSCOPIC (IRRIGATION / IRRIGATOR) IMPLANT
SHEARS HARMONIC ACE PLUS 36CM (ENDOMECHANICALS) IMPLANT
SLEEVE XCEL OPT CAN 5 100 (ENDOMECHANICALS) ×3 IMPLANT
STRIP CLOSURE SKIN 1/2X4 (GAUZE/BANDAGES/DRESSINGS) ×2 IMPLANT
SUT VICRYL 0 UR6 27IN ABS (SUTURE) ×6 IMPLANT
SUT VICRYL 4-0 PS2 18IN ABS (SUTURE) ×6 IMPLANT
TOWEL OR 17X24 6PK STRL BLUE (TOWEL DISPOSABLE) ×6 IMPLANT
TROCAR BLADELESS OPT 5 100 (ENDOMECHANICALS) ×3 IMPLANT
TROCAR XCEL NON-BLD 11X100MML (ENDOMECHANICALS) IMPLANT
WARMER LAPAROSCOPE (MISCELLANEOUS) ×3 IMPLANT
WATER STERILE IRR 1000ML POUR (IV SOLUTION) ×3 IMPLANT

## 2016-03-04 NOTE — Discharge Instructions (Signed)
Diagnostic Laparoscopy A diagnostic laparoscopy is a procedure to diagnose diseases in the abdomen. During the procedure, a thin, lighted, pencil-sized instrument called a laparoscope is inserted into the abdomen through an incision. The laparoscope allows your health care provider to look at the organs inside your body. LET [facility] CARE PROVIDER KNOW ABOUT:  Any allergies you have.  All medicines you are taking, including vitamins, herbs, eye drops, creams, and over-the-counter medicines.  Previous problems you or members of your family have had with the use of anesthetics.  Any blood disorders you have.  Previous surgeries you have had.  Medical conditions you have. RISKS AND COMPLICATIONS  Generally, this is a safe procedure. However, problems can occur, which may include:  Infection.  Bleeding.  Damage to other organs.  Allergic reaction to the anesthetics used during the procedure. BEFORE THE PROCEDURE  Do not eat or drink anything after midnight on the night before the procedure or as directed by your health care provider.  Ask your health care provider about:  Changing or stopping your regular medicines.  Taking medicines such as aspirin and ibuprofen. These medicines can thin your blood. Do not take these medicines before your procedure if your health care provider instructs you not to.  Plan to have someone take you home after the procedure. PROCEDURE  You may be given a medicine to help you relax (sedative).  You will be given a medicine to make you sleep (general anesthetic).  Your abdomen will be inflated with a gas. This will make your organs easier to see.  Small incisions will be made in your abdomen.  A laparoscope and other small instruments will be inserted into the abdomen through the incisions.  A tissue sample may be removed from an organ in the abdomen for examination.  The instruments will be removed from the abdomen.  The gas will be  released.  The incisions will be closed with stitches (sutures). AFTER THE PROCEDURE  Your blood pressure, heart rate, breathing rate, and blood oxygen level will be monitored often until the medicines you were given have worn off.   This information is not intended to replace advice given to you by your health care provider. Make sure you discuss any questions you have with your health care provider.   Document Released: 10/27/2000 Document Revised: 04/11/2015 Document Reviewed: 03/03/2014 Elsevier Interactive Patient Education 2016 [place] INSTRUCTIONS: Laparoscopy  The following instructions have been prepared to help you care for yourself upon your return home today.  Wound care:  Do not get the incision wet for the first 24 hours. The incision should be kept clean and dry.  The Band-Aids or dressings may be removed the day after surgery.  Should the incision become sore, red, and swollen after the first week, check with your doctor.  Personal hygiene:  Shower the day after your procedure.  Activity and limitations:  Do NOT drive or operate any equipment today.  Do NOT lift anything more than 15 pounds for 2-3 weeks after surgery.  Do NOT rest in bed all day.  Walking is encouraged. Walk each day, starting slowly with 5-minute walks 3 or 4 times a day. Slowly increase the length of your walks.  Walk up and down stairs slowly.  Do NOT do strenuous activities, such as golfing, playing tennis, bowling, running, biking, weight lifting, gardening, mowing, or vacuuming for 2-4 weeks. Ask your doctor when it is okay to start.  Diet: Eat a light meal  as desired this evening. You may resume your usual diet tomorrow.  Return to work: This is dependent on the type of work you do. For the most part you can return to a desk job within a week of surgery. If you are more active at work, please discuss this with your doctor.  What to expect after your surgery: You  may have a slight burning sensation when you urinate on the first day. You may have a very small amount of blood in the urine. Expect to have a small amount of vaginal discharge/light bleeding for 1-2 weeks. It is not unusual to have abdominal soreness and bruising for up to 2 weeks. You may be tired and need more rest for about 1 week. You may experience shoulder pain for 24-72 hours. Lying flat in bed may relieve it.  Call your doctor for any of the following:  Develop a fever of 100.4 or greater  Inability to urinate 6 hours after discharge from hospital  Severe pain not relieved by pain medications  Persistent of heavy bleeding at incision site  Redness or swelling around incision site after a week  Increasing nausea or vomiting  Patient Signature________________________________________ Nurse Signature_________________________________________  May remove the patch behind your ear on or before Friday 03/07/16. Wash your hands with soap and water after any contact with the patch.  Do not take ibuprofen products until 5:00pm 03/04/16.

## 2016-03-04 NOTE — Op Note (Addendum)
03/04/2016  10:58 AM  PATIENT:  Krystal West  45 y.o. female  PRE-OPERATIVE DIAGNOSIS: DYSMENORRHEA  POST-OPERATIVE DIAGNOSIS:  PELVIC INFLAMMATORY DISEASE  PROCEDURE:  Procedure(s): LAPAROSCOPY DIAGNOSTIC (N/A)  SURGEON:  Surgeon(s) and Role:    * Emily Filbert, MD - Primary  ASSISTANTS: none   ANESTHESIA:   none  EBL:  Total I/O In: -  Out: 1 [Blood:1]  BLOOD ADMINISTERED:none  DRAINS: none   LOCAL MEDICATIONS USED:  MARCAINE     SPECIMEN:  No Specimen  DISPOSITION OF SPECIMEN:  N/A  COUNTS:  YES  TOURNIQUET:  * No tourniquets in log *  DICTATION: .Dragon Dictation  PLAN OF CARE: Discharge to home after PACU  PATIENT DISPOSITION:  PACU - hemodynamically stable.   Delay start of Pharmacological VTE agent (>24hrs) due to surgical blood loss or risk of bleeding: not applicable   The risks, benefits, alternatives of surgery were explained understood, accepted. All questions were answered.  Her urine pregnancy test was negative. She was taken to the operating room and general anesthesia was applied without complication. She was placed in dorsal lithotomy position. Her abdomen and vagina were prepped and draped in the usual sterile fashion. A time out procedure was done. A bimanual exam revealed a normal, size and shape mobile uterus with nonenlarged adnexa. A Hulka manipulator was placed on the cervix. Gloves were changed and attention was turned to the abdomen. Approximately 10 mL of 0.5% Marcaine was used to infiltrate the subcutaneous tissue at the umbilicus. A vertical 29mm incision was made. A Veres needle was placed in the pelvis. Low-flow CO2 was used to insufflate the abdomen to approximately 3 L. After good pneumoperitoneum was established, a 5 mm trocar was placed. Laparoscopy confirmed correct placement. She was placed in the Trendelenburg position. A 5 mm port was placed in the left lower quadrant with direct laparoscopic visualization. Patient abdominal pressure was  always less than 15. Her uterus showed some small fibroids. Her right tube was intact and the left showed evidence of a previous BTL.There was no evidence of endometriosis. 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 The CO2 was allowed to escape from her abdomen. Dermabond was used to close both incisions.The Hulka manipulator was removed. She was extubated and taken to recovery in stable condition. She tolerated the procedure well. The instrument, sponge, and needle counts were correct.

## 2016-03-04 NOTE — Anesthesia Preprocedure Evaluation (Signed)
Anesthesia Evaluation  Patient identified by MRN, date of birth, ID band Patient awake    Reviewed: Allergy & Precautions, NPO status , Patient's Chart, lab work & pertinent test results  History of Anesthesia Complications Negative for: history of anesthetic complications  Airway Mallampati: II  TM Distance: >3 FB Neck ROM: Full    Dental  (+) Teeth Intact   Pulmonary neg pulmonary ROS,    Pulmonary exam normal breath sounds clear to auscultation       Cardiovascular hypertension, + dysrhythmias (reports h/o irregular heart beat on occasion)  Rhythm:Regular Rate:Normal     Neuro/Psych negative neurological ROS     GI/Hepatic Neg liver ROS, GERD  Controlled,  Endo/Other  negative endocrine ROS  Renal/GU negative Renal ROS     Musculoskeletal negative musculoskeletal ROS (+)   Abdominal (+) + obese,   Peds negative pediatric ROS (+)  Hematology negative hematology ROS (+)   Anesthesia Other Findings   Reproductive/Obstetrics negative OB ROS                             Anesthesia Physical Anesthesia Plan  ASA: II  Anesthesia Plan: General   Post-op Pain Management:    Induction: Intravenous  Airway Management Planned: Oral ETT  Additional Equipment:   Intra-op Plan:   Post-operative Plan: Extubation in OR  Informed Consent: I have reviewed the patients History and Physical, chart, labs and discussed the procedure including the risks, benefits and alternatives for the proposed anesthesia with the patient or authorized representative who has indicated his/her understanding and acceptance.   Dental advisory given  Plan Discussed with: CRNA, Surgeon and Anesthesiologist  Anesthesia Plan Comments:         Anesthesia Quick Evaluation

## 2016-03-04 NOTE — Transfer of Care (Signed)
Immediate Anesthesia Transfer of Care Note  Patient: Krystal West  Procedure(s) Performed: Procedure(s): LAPAROSCOPY DIAGNOSTIC (N/A)  Patient Location: PACU  Anesthesia Type:General  Level of Consciousness: awake, alert  and oriented  Airway & Oxygen Therapy: Patient Spontanous Breathing and Patient connected to nasal cannula oxygen  Post-op Assessment: Report given to RN and Post -op Vital signs reviewed and stable  Post vital signs: Reviewed and stable  Last Vitals:  Vitals:   03/04/16 1000 03/04/16 1115  BP: 117/83   Pulse: 97 (P) 85  Resp: 20 (P) 10  Temp: 36.8 C 36.6 C    Last Pain:  Vitals:   03/04/16 1000  TempSrc: Oral      Patients Stated Pain Goal: 3 ([id number])  Complications: No apparent anesthesia complications

## 2016-03-04 NOTE — H&P (Signed)
45 yo engaged AA P4 (69, 61, 72, and 32 yo kids) here with the issue of pain with periods for "years". She has been prescribed IBU, tramadol and tylenol #3. She has not had a recent gyn u/s and no laparoscopy ever.   Her pain starts a few days prior to menses, lasts during her period (10-12 days), and then lasts for a few days after.  She had a normal pap 1/15 and a u/s 9/15 that showed a 12 cm uterus and 2 very small fibroids. Recently her hbg was 13.6.  Review of Systems She had a BTL but then it failed and that is how her 37 yo daughter was born). She is not sexually active for the last 2 years. On disability for psych issues     Patient's last menstrual period was 02/14/2016 (exact date).    Past Medical History:  Diagnosis Date  . Anxiety 2004  . Bipolar disorder ([place]) 2004  . Depression 2004  . GERD (gastroesophageal reflux disease)   . Hypertension    patient denies  . Irregular heart beats    occ  . Post traumatic stress disorder (PTSD) 2004  . Tendonitis    right hand, shoulder    Past Surgical History:  Procedure Laterality Date  . TUBAL LIGATION  1995    Family History  Problem Relation Age of Onset  . Hypertension Mother   . Hypertension Father   . Heart disease Father   . Schizophrenia Brother   . Multiple sclerosis Brother   . Schizophrenia Brother   . Cancer Maternal Grandmother     breast ca  . Stroke Neg Hx   . Diabetes Neg Hx     Social History:  reports that she has never smoked. She has never used smokeless tobacco. She reports that she drinks alcohol. She reports that she uses drugs, including Cocaine and Marijuana.  Allergies: No Known Allergies  Prescriptions Prior to Admission  Medication Sig Dispense Refill Last Dose  . acetaminophen-codeine (TYLENOL #3) 300-30 MG tablet Take 1 tablet by mouth every 6 (six) hours as needed for moderate pain (severe menstrual pain). 30 tablet 0 Past Week at Unknown time  . amitriptyline (ELAVIL) 50  MG tablet Take 1 tablet (50 mg total) by mouth at bedtime. 21 tablet 0 03/03/2016 at Unknown time  . clonazePAM (KLONOPIN) 1 MG tablet Take 1 tablet (1 mg total) by mouth 2 (two) times daily. 42 tablet 0 03/03/2016 at Unknown time  . hydrocortisone cream 1 % Apply 1 application topically daily as needed for itching.   03/03/2016 at Unknown time  . hydrOXYzine (ATARAX/VISTARIL) 25 MG tablet Take 25 mg by mouth 3 (three) times daily as needed for anxiety.    03/03/2016 at Unknown time  . lurasidone (LATUDA) 40 MG TABS tablet Take 40 mg by mouth daily with breakfast.   Past Month at Unknown time  . Multiple Vitamin (MULTIVITAMIN WITH MINERALS) TABS tablet Take 1 tablet by mouth daily.   Past Week at Unknown time  . omeprazole (PRILOSEC) 20 MG capsule take 1 capsule by mouth once daily 30 capsule 3 03/04/2016 at Unknown time  . triamcinolone cream (KENALOG) 0.1 % Apply 1 application topically daily as needed (For irritation.).   Past Month at Unknown time  . naproxen (NAPROSYN) 500 MG tablet Take 1 tablet (500 mg total) by mouth 2 (two) times daily. (Patient not taking: Reported on 01/25/2016) 20 tablet 0 Not Taking at Unknown time  . ondansetron (ZOFRAN ODT)  4 MG disintegrating tablet Take 1 tablet (4 mg total) by mouth every 8 (eight) hours as needed for nausea or vomiting. (Patient not taking: Reported on 01/25/2016) 10 tablet 0 Not Taking at Unknown time  . polyethylene glycol powder (GLYCOLAX/MIRALAX) powder Take 17 g by mouth daily as needed (Constipation.). (Patient not taking: Reported on 01/25/2016) 3350 g 1 Not Taking at Unknown time  . traMADol (ULTRAM) 50 MG tablet Take 1 tablet (50 mg total) by mouth every 8 (eight) hours as needed for severe pain. (Patient not taking: Reported on 01/25/2016) 20 tablet 0 Not Taking at Unknown time    ROS  Blood pressure 117/83, pulse 97, temperature 98.3 F (36.8 C), temperature source Oral, resp. rate 20, last menstrual period 02/14/2016, SpO2 99 %. Physical  Exam Heart- rrr Lungs- CTAB Abd- benign No results found for this or any previous visit (from the past 24 hour(s)).  No results found.  Assessment/Plan: Severe dysmenorrhea- plan for laparoscopy.  She understands the risks of surgery, including, but not to infection, bleeding, DVTs, damage to bowel, bladder, ureters. She wishes to proceed.     Suhayb Anzalone C Sharryn Belding 03/04/2016, 10:05 AM

## 2016-03-04 NOTE — Anesthesia Procedure Notes (Signed)
Procedure Name: Intubation Date/Time: 03/04/2016 10:42 AM Performed by: Jonna Munro Pre-anesthesia Checklist: Patient identified, Emergency Drugs available, Suction available, Timeout performed and Patient being monitored Patient Re-evaluated:Patient Re-evaluated prior to inductionOxygen Delivery Method: Circle system utilized Preoxygenation: Pre-oxygenation with 100% oxygen Intubation Type: IV induction Ventilation: Mask ventilation without difficulty Laryngoscope Size: Mac and 3 Grade View: Grade I Tube type: Oral Tube size: 7.0 mm Number of attempts: 1 Airway Equipment and Method: Stylet Placement Confirmation: ETT inserted through vocal cords under direct vision,  positive ETCO2 and breath sounds checked- equal and bilateral Secured at: 22 cm Tube secured with: Tape Dental Injury: Teeth and Oropharynx as per pre-operative assessment

## 2016-03-05 ENCOUNTER — Other Ambulatory Visit: Payer: Self-pay | Admitting: Family Medicine

## 2016-03-05 ENCOUNTER — Encounter (HOSPITAL_COMMUNITY): Payer: Self-pay | Admitting: Anesthesiology

## 2016-03-05 ENCOUNTER — Encounter: Payer: Self-pay | Admitting: Obstetrics & Gynecology

## 2016-03-05 ENCOUNTER — Telehealth: Payer: Self-pay | Admitting: *Deleted

## 2016-03-05 DIAGNOSIS — N946 Dysmenorrhea, unspecified: Secondary | ICD-10-CM

## 2016-03-05 MED ORDER — POLYETHYLENE GLYCOL 3350 17 GM/SCOOP PO POWD: 17.0000 g | Freq: Every day | ORAL | 1 refills | Status: AC | PRN

## 2016-03-05 NOTE — Telephone Encounter (Signed)
Return call to patient regarding medication reaction to pain med.  Advised patient that since the provider at [facility] did not prescribe the pain med, she would need to contact the provider that completed the procedure.  Nurse will send refill request to PCP for the Miralax.  Derl Barrow, RN

## 2016-03-05 NOTE — Telephone Encounter (Signed)
Pt had surgery yesterday and is having a reaction to percocet and would like something else for pain. Pt stated she is unable to use the bathroom and would like a refill on Miralax. Please advise. Thanks! ep

## 2016-03-05 NOTE — Anesthesia Postprocedure Evaluation (Signed)
Anesthesia Post Note  Patient: Krystal West  Procedure(s) Performed: Procedure(s) (LRB): LAPAROSCOPY DIAGNOSTIC (N/A)  Patient location during evaluation: PACU Anesthesia Type: General Level of consciousness: awake Pain management: pain level controlled Vital Signs Assessment: post-procedure vital signs reviewed and stable Respiratory status: spontaneous breathing Cardiovascular status: stable Postop Assessment: no signs of nausea or vomiting Anesthetic complications: no     Last Vitals:  Vitals:   03/04/16 1245 03/04/16 1330  BP: 112/71 105/70  Pulse: 72 65  Resp: 13 14  Temp: 36.4 C 36.9 C    Last Pain:  Vitals:   03/04/16 1330  TempSrc:   PainSc: 3    Pain Goal: Patients Stated Pain Goal: 3 (03/04/16 1245)               [place]

## 2016-03-05 NOTE — Telephone Encounter (Signed)
Received message left on nurse voicemail on 03/05/16 at 1431.  Patient states she had surgery yesterday with Dr. Hulan Fray.  States the percocet she was given isn't working and she wants to get something different.  Patient requests a return call to [phone].

## 2016-03-05 NOTE — Telephone Encounter (Signed)
Received message on nurse voicemail on 03/05/16 at 1042.  Patient states she had surgery yesterday and the pain medication she was given isn't working.  Would like to know if she could get something else.  Requests a return call to [phone].

## 2016-03-06 ENCOUNTER — Encounter (HOSPITAL_COMMUNITY): Payer: Self-pay | Admitting: Obstetrics & Gynecology

## 2016-03-07 NOTE — Telephone Encounter (Signed)
Addressed patient's concerns via mychart

## 2016-03-07 NOTE — Telephone Encounter (Signed)
Called and spoke with patient. She had laparoscopic surgery 3 days ago. C/o pain 9/10 in spite of ordered percocet every 4 hours. Reports burning around incision sites, no swelling or drainage. She is having some gas pain in her shoulder. Reports she is moving around and is passing gas.  Advised patient to continue moving as much as she can (nothing strenuous) to help gas pain. She was not prescribed ibuprofen for pain so I told her to have someone pick otc ibuprofen and take 600mg  every 6hrs around the clock to augment the percocet she already had. If the pain is still rating a 9/10 after a few doses then she should go to the MAU to be evaluated. Patient voiced understanding.

## 2016-03-07 NOTE — Telephone Encounter (Signed)
Patient has left another message on nurse voicemail today 03/07/16 at 1119.  States she has left several messages and no one has returned her call.  States the pain medication isn't working and she doesn't want to double up on it.  States "I need help.  I hurt and I'm tired.  I just want to take a pain pill that works and go to sleep."  Requests a return call to [phone].

## 2016-04-21 ENCOUNTER — Telehealth: Payer: Self-pay | Admitting: *Deleted

## 2016-04-21 ENCOUNTER — Ambulatory Visit: Payer: Medicaid Other | Admitting: Obstetrics & Gynecology

## 2016-04-21 NOTE — Telephone Encounter (Signed)
Pt left message on 9/14 @ 1045 stating that she had surgery on 8/1 and would like a prescription for medication due to bad period cramps.

## 2016-05-08 NOTE — Telephone Encounter (Signed)
I called Krystal West and was unable to leave a message- heard several rings , but no voicemail.

## 2016-05-12 NOTE — Telephone Encounter (Signed)
I called Krystal West again and left a message I was returning her call from a while back- we tried to reach you and could not. If you are still having any issues- please call us back and leave a detailed message.

## 2016-06-23 ENCOUNTER — Other Ambulatory Visit (HOSPITAL_COMMUNITY)
Admission: RE | Admit: 2016-06-23 | Discharge: 2016-06-23 | Disposition: A | Discharge status: Home / Self Care | Payer: Medicaid Other | Source: Ambulatory Visit | Attending: Family Medicine | Admitting: Family Medicine

## 2016-06-23 ENCOUNTER — Other Ambulatory Visit: Payer: Self-pay | Admitting: Family Medicine

## 2016-06-23 ENCOUNTER — Encounter: Payer: Self-pay | Admitting: Student

## 2016-06-23 ENCOUNTER — Ambulatory Visit (INDEPENDENT_AMBULATORY_CARE_PROVIDER_SITE_OTHER): Payer: Medicaid Other | Admitting: Student

## 2016-06-23 VITALS — BP 126/84 | HR 86 | Temp 98.0°F | Wt 221.0 lb

## 2016-06-23 DIAGNOSIS — Z23 Encounter for immunization: Secondary | ICD-10-CM | POA: Diagnosis not present

## 2016-06-23 DIAGNOSIS — Z113 Encounter for screening for infections with a predominantly sexual mode of transmission: Secondary | ICD-10-CM | POA: Insufficient documentation

## 2016-06-23 DIAGNOSIS — A599 Trichomoniasis, unspecified: Secondary | ICD-10-CM

## 2016-06-23 DIAGNOSIS — N898 Other specified noninflammatory disorders of vagina: Secondary | ICD-10-CM

## 2016-06-23 LAB — POCT WET PREP (WET MOUNT): Clue Cells Wet Prep Whiff POC: NEGATIVE

## 2016-06-23 MED ORDER — METRONIDAZOLE 500 MG PO TABS: 2000.0000 mg | ORAL_TABLET | Freq: Once | ORAL | 0 refills | Status: AC

## 2016-06-23 NOTE — Patient Instructions (Signed)
You have Trichomonas Please take flagyl 2,000 mg all at once Please inform your sexual partners to get treated DO NOT have sex until 7 days after treatment and until your partners are treated Call office at [phone] with questions or concerns

## 2016-06-23 NOTE — Assessment & Plan Note (Signed)
Wet prep showed trichomonas. Will treat with Flagyl 2 g. Patient counseled to abstain from sexual intercourse for at least one week after he is treated. She was counseled to inform her partner is to be tested and treated as necessary. - GC/CT collected today and will follow

## 2016-06-23 NOTE — Progress Notes (Signed)
   Subjective:    Patient ID: Krystal West, female    DOB: 21-Jul-1971, 45 y.o.   MRN: [id number]   [id number] discharge  HPI: 45 year old female presenting for vaginal discharge  Vaginal discharge - Notices it for 1 week - Reports mild vaginal itching, no vaginal irritation, no dysuria, no fevers - Had had some lower abdominal pain - She is sexually active with one female partner and intermittently uses protection with condoms - Patient's last menstrual period was 06/19/2016.   Smoking status reviewed  Review of Systems  Per HPI, chest pain, shortness of breath, abdominal pain, N/V/D  Objective:  BP 126/84   Pulse 86   Temp 98 F (36.7 C) (Oral)   Wt 221 lb (100.2 kg)   LMP 06/19/2016   SpO2 100%   BMI 35.67 kg/m  Vitals and nursing note reviewed  General: NAD Cardiac: RRR, Respiratory: CTAB, normal effort Abdomen: Obese, soft, nontender, nondistended, Bowel sounds present Skin: warm and dry,  Neuro: alert and oriented, no focal deficits   Pelvic: Normal EGBUS, normal vaginal canal with significant green watery discharge, normal cervix with no CMT, normal mobile uterus, normal adnexa with no masses, no adnexal tenderness , exam mildly limited by body habitus   Assessment & Plan:    Trichomonas infection Wet prep showed trichomonas. Will treat with Flagyl 2 g. Patient counseled to abstain from sexual intercourse for at least one week after he is treated. She was counseled to inform her partner is to be tested and treated as necessary. - GC/CT collected today and will follow    Kelby Adell A. Lincoln Brigham MD, [facility] Resident PGY-3 Pager [phone]

## 2016-06-24 LAB — CERVICOVAGINAL ANCILLARY ONLY
Chlamydia: NEGATIVE
NEISSERIA GONORRHEA: NEGATIVE

## 2016-10-20 ENCOUNTER — Encounter (HOSPITAL_COMMUNITY): Payer: Self-pay | Admitting: Emergency Medicine

## 2016-10-20 ENCOUNTER — Ambulatory Visit (HOSPITAL_COMMUNITY)
Admission: EM | Admit: 2016-10-20 | Discharge: 2016-10-20 | Disposition: A | Discharge status: Home / Self Care | Payer: Medicaid Other | Attending: Family Medicine | Admitting: Family Medicine

## 2016-10-20 ENCOUNTER — Ambulatory Visit (INDEPENDENT_AMBULATORY_CARE_PROVIDER_SITE_OTHER): Payer: Medicaid Other

## 2016-10-20 DIAGNOSIS — L309 Dermatitis, unspecified: Secondary | ICD-10-CM | POA: Diagnosis not present

## 2016-10-20 DIAGNOSIS — S93601A Unspecified sprain of right foot, initial encounter: Secondary | ICD-10-CM

## 2016-10-20 DIAGNOSIS — F3170 Bipolar disorder, currently in remission, most recent episode unspecified: Secondary | ICD-10-CM

## 2016-10-20 MED ORDER — CLOTRIMAZOLE-BETAMETHASONE 1-0.05 % EX CREA: TOPICAL_CREAM | CUTANEOUS | 1 refills | Status: DC

## 2016-10-20 MED ORDER — PREDNISONE 20 MG PO TABS: ORAL_TABLET | ORAL | 0 refills | Status: DC

## 2016-10-20 MED ORDER — CLONAZEPAM 1 MG PO TABS: 1.0000 mg | ORAL_TABLET | Freq: Two times a day (BID) | ORAL | 0 refills | Status: AC

## 2016-10-20 NOTE — ED Provider Notes (Signed)
[place]    CSN: [id number] Arrival date & time: 10/20/16  1305     History   Chief Complaint Chief Complaint  Patient presents with  . Foot Pain  . Eczema    HPI Krystal West is a 46 y.o. female.   The patient presented to the [facility] with a complaint of right foot pain secondary to a fall that occurred last night. The patient had some swelling noted and limited ROM.    The patient also complained of a flare up of her eczema.  Patient works as a [employer]ar[employer] manager. She is not feeling she needs a note today.      Past Medical History:  Diagnosis Date  . Anxiety 2004  . Bipolar disorder ([place]) 2004  . Depression 2004  . GERD (gastroesophageal reflux disease)   . Hypertension    patient denies  . Irregular heart beats    occ  . Post traumatic stress disorder (PTSD) 2004  . Tendonitis    right hand, shoulder    Patient Active Problem List   Diagnosis Date Noted  . Trichomonas infection 06/23/2016  . Dysmenorrhea 09/24/2015  . Contraception 12/11/2010  . Chronic pelvic pain in female 05/04/2009  . Bipolar disorder ([place]) 10/01/2006    Past Surgical History:  Procedure Laterality Date  . LAPAROSCOPY N/A 03/04/2016   Procedure: LAPAROSCOPY DIAGNOSTIC;  Surgeon: Emily Filbert, MD;  Location: [facility];  Service: Gynecology;  Laterality: N/A;  . TUBAL LIGATION  1995    OB History    Gravida Para Term Preterm AB Living   4 4 4    0 4   SAB TAB Ectopic Multiple Live Births   0 0 0 0         Home Medications    Prior to Admission medications   Medication Sig Start Date End Date Taking? Authorizing Provider  amitriptyline (ELAVIL) 50 MG tablet Take 1 tablet (50 mg total) by mouth at bedtime. 11/12/15   Ashly Windell Moulding, DO  clonazePAM (KLONOPIN) 1 MG tablet Take 1 tablet (1 mg total) by mouth 2 (two) times daily. 10/20/16   Robyn Haber, MD  clotrimazole-betamethasone (LOTRISONE) cream Apply to affected area 2 times daily prn 10/20/16   Robyn Haber, MD    hydrocortisone cream 1 % Apply 1 application topically daily as needed for itching.    Historical Provider, MD  hydrOXYzine (ATARAX/VISTARIL) 25 MG tablet Take 25 mg by mouth 3 (three) times daily as needed for anxiety.     Historical Provider, MD  lurasidone (LATUDA) 40 MG TABS tablet Take 40 mg by mouth daily with breakfast.    Historical Provider, MD  Multiple Vitamin (MULTIVITAMIN WITH MINERALS) TABS tablet Take 1 tablet by mouth daily.    Historical Provider, MD  omeprazole (PRILOSEC) 20 MG capsule take 1 capsule by mouth once daily 06/23/16   Ashly M Gottschalk, DO  polyethylene glycol powder (GLYCOLAX/MIRALAX) powder Take 17 g by mouth daily as needed (Constipation.). 03/05/16   Janora Norlander, DO  predniSONE (DELTASONE) 20 MG tablet Two daily with food 10/20/16   Robyn Haber, MD  triamcinolone cream (KENALOG) 0.1 % Apply 1 application topically daily as needed (For irritation.).    Historical Provider, MD    Family History Family History  Problem Relation Age of Onset  . Hypertension Mother   . Hypertension Father   . Heart disease Father   . Schizophrenia Brother   . Multiple sclerosis Brother   . Schizophrenia Brother   .  Cancer Maternal Grandmother     breast ca  . Stroke Neg Hx   . Diabetes Neg Hx     Social History Social History  Substance Use Topics  . Smoking status: Never Smoker  . Smokeless tobacco: Never Used  . Alcohol use 0.0 oz/week    1 - 2 Shots of liquor per week     Comment: history no longer drinking     Allergies   Patient has no known allergies.   Review of Systems Review of Systems  Musculoskeletal: Positive for gait problem and joint swelling.  Skin: Positive for rash.  All other systems reviewed and are negative.    Physical Exam Triage Vital Signs ED Triage Vitals  Enc Vitals Group     BP 10/20/16 1408 115/73     Pulse Rate 10/20/16 1408 85     Resp 10/20/16 1408 18     Temp 10/20/16 1408 98.1 F (36.7 C)     Temp Source  10/20/16 1408 Oral     SpO2 10/20/16 1408 99 %     Weight --      Height --      Head Circumference --      Peak Flow --      Pain Score 10/20/16 1409 10     Pain Loc --      Pain Edu? --      Excl. in [place]? --    No data found.   Updated Vital Signs BP 115/73 (BP Location: Right Arm)   Pulse 85   Temp 98.1 F (36.7 C) (Oral)   Resp 18   SpO2 99%    Physical Exam  Constitutional: She is oriented to person, place, and time. She appears well-developed and well-nourished.  HENT:  Right Ear: External ear normal.  Left Ear: External ear normal.  Mouth/Throat: Oropharynx is clear and moist.  Eyes: Conjunctivae are normal. Pupils are equal, round, and reactive to light.  Neck: Normal range of motion. Neck supple.  Pulmonary/Chest: Effort normal.  Musculoskeletal: Normal range of motion. She exhibits tenderness.  Tender dorsal foot 2 cm anterior to the ankle joint line.  Neurological: She is alert and oriented to person, place, and time.  Skin: Skin is warm and dry.  Patchy scaly skin left flexural and decubital area and right medial foot  Nursing note and vitals reviewed.    UC Treatments / Results  Labs (all labs ordered are listed, but only abnormal results are displayed) Labs Reviewed - No data to display  EKG  EKG Interpretation None       Radiology Dg Foot Complete Right  Result Date: 10/20/2016 CLINICAL DATA:  Fall this morning, heard a pop sound in her right foot, pain with walking EXAM: RIGHT FOOT COMPLETE - 3+ VIEW COMPARISON:  None. FINDINGS: Three views of the right foot submitted. No acute fracture or subluxation. No radiopaque foreign body. IMPRESSION: Negative. Electronically Signed   By: Lahoma Crocker M.D.   On: 10/20/2016 14:27    Procedures Procedures (including critical care time)  Medications Ordered in UC Medications - No data to display   Initial Impression / Assessment and Plan / UC Course  I have reviewed the triage vital signs and the  nursing notes.  Pertinent labs & imaging results that were available during my care of the patient were reviewed by me and considered in my medical decision making (see chart for details).     Final Clinical Impressions(s) / UC Diagnoses  Final diagnoses:  Sprain of right foot, initial encounter  Eczema, unspecified type    New Prescriptions New Prescriptions   CLOTRIMAZOLE-BETAMETHASONE (LOTRISONE) CREAM    Apply to affected area 2 times daily prn   PREDNISONE (DELTASONE) 20 MG TABLET    Two daily with food     Robyn Haber, MD 10/20/16 1435

## 2016-10-20 NOTE — ED Triage Notes (Signed)
The patient presented to the [facility] with a complaint of right foot pain secondary to a fall that occurred last night. The patient had some swelling noted and limited ROM. The patient also complained of a flare up of her eczema.

## 2016-11-22 ENCOUNTER — Encounter (HOSPITAL_COMMUNITY): Payer: Self-pay | Admitting: Emergency Medicine

## 2016-11-22 ENCOUNTER — Ambulatory Visit (HOSPITAL_COMMUNITY)
Admission: EM | Admit: 2016-11-22 | Discharge: 2016-11-22 | Disposition: A | Discharge status: Home / Self Care | Payer: Medicaid Other | Attending: Family Medicine | Admitting: Family Medicine

## 2016-11-22 DIAGNOSIS — J00 Acute nasopharyngitis [common cold]: Secondary | ICD-10-CM | POA: Diagnosis not present

## 2016-11-22 MED ORDER — BENZONATATE 100 MG PO CAPS: 100.0000 mg | ORAL_CAPSULE | Freq: Three times a day (TID) | ORAL | 0 refills | Status: DC

## 2016-11-22 MED ORDER — ACETAMINOPHEN 325 MG PO TABS
ORAL_TABLET | ORAL | Status: AC
  Filled 2016-11-22: qty 2

## 2016-11-22 MED ORDER — IPRATROPIUM BROMIDE 0.06 % NA SOLN: 2.0000 | Freq: Four times a day (QID) | NASAL | 0 refills | Status: DC

## 2016-11-22 MED ORDER — CLOTRIMAZOLE-BETAMETHASONE 1-0.05 % EX CREA: TOPICAL_CREAM | CUTANEOUS | 1 refills | Status: AC

## 2016-11-22 MED ORDER — ACETAMINOPHEN 325 MG PO TABS
650.0000 mg | ORAL_TABLET | Freq: Once | ORAL | Status: AC
  Administered 2016-11-22: 650 mg via ORAL

## 2016-11-22 NOTE — ED Provider Notes (Signed)
CSN: [id number]     Arrival date & time 11/22/16  1447 History   None    Chief Complaint  Patient presents with  . URI   (Consider location/radiation/quality/duration/timing/severity/associated sxs/prior Treatment) Patient c/o cough and uri sx's for 3 days.  She c/o aches.   The history is provided by the patient.  URI  Presenting symptoms: congestion, cough and fatigue   Severity:  Moderate Onset quality:  Sudden Duration:  3 days Timing:  Constant Progression:  Worsening Chronicity:  New Relieved by:  Nothing Worsened by:  Nothing Ineffective treatments:  None tried   Past Medical History:  Diagnosis Date  . Anxiety 2004  . Bipolar disorder ([place]) 2004  . Depression 2004  . GERD (gastroesophageal reflux disease)   . Hypertension    patient denies  . Irregular heart beats    occ  . Post traumatic stress disorder (PTSD) 2004  . Tendonitis    right hand, shoulder   Past Surgical History:  Procedure Laterality Date  . LAPAROSCOPY N/A 03/04/2016   Procedure: LAPAROSCOPY DIAGNOSTIC;  Surgeon: Emily Filbert, MD;  Location: [facility];  Service: Gynecology;  Laterality: N/A;  . TUBAL LIGATION  1995   Family History  Problem Relation Age of Onset  . Hypertension Mother   . Hypertension Father   . Heart disease Father   . Schizophrenia Brother   . Multiple sclerosis Brother   . Schizophrenia Brother   . Cancer Maternal Grandmother     breast ca  . Stroke Neg Hx   . Diabetes Neg Hx    Social History  Substance Use Topics  . Smoking status: Never Smoker  . Smokeless tobacco: Never Used  . Alcohol use 0.0 oz/week    1 - 2 Shots of liquor per week     Comment: history no longer drinking   OB History    Gravida Para Term Preterm AB Living   4 4 4    0 4   SAB TAB Ectopic Multiple Live Births   0 0 0 0       Review of Systems  Constitutional: Positive for fatigue.  HENT: Positive for congestion.   Eyes: Negative.   Respiratory: Positive for cough.    Cardiovascular: Negative.   Gastrointestinal: Negative.   Endocrine: Negative.   Genitourinary: Negative.   Musculoskeletal: Negative.   Allergic/Immunologic: Negative.   Neurological: Negative.   Hematological: Negative.   Psychiatric/Behavioral: Negative.     Allergies  Patient has no known allergies.  Home Medications   Prior to Admission medications   Medication Sig Start Date End Date Taking? Authorizing Provider  amitriptyline (ELAVIL) 50 MG tablet Take 1 tablet (50 mg total) by mouth at bedtime. 11/12/15  Yes Ashly Windell Moulding, DO  clonazePAM (KLONOPIN) 1 MG tablet Take 1 tablet (1 mg total) by mouth 2 (two) times daily. 10/20/16  Yes Robyn Haber, MD  hydrocortisone cream 1 % Apply 1 application topically daily as needed for itching.   Yes Historical Provider, MD  hydrOXYzine (ATARAX/VISTARIL) 25 MG tablet Take 25 mg by mouth 3 (three) times daily as needed for anxiety.    Yes Historical Provider, MD  lurasidone (LATUDA) 40 MG TABS tablet Take 40 mg by mouth daily with breakfast.   Yes Historical Provider, MD  Multiple Vitamin (MULTIVITAMIN WITH MINERALS) TABS tablet Take 1 tablet by mouth daily.   Yes Historical Provider, MD  omeprazole (PRILOSEC) 20 MG capsule take 1 capsule by mouth once daily 06/23/16  Yes Ashly  M Gottschalk, DO  polyethylene glycol powder (GLYCOLAX/MIRALAX) powder Take 17 g by mouth daily as needed (Constipation.). 03/05/16  Yes Ashly Windell Moulding, DO  predniSONE (DELTASONE) 20 MG tablet Two daily with food 10/20/16  Yes Robyn Haber, MD  triamcinolone cream (KENALOG) 0.1 % Apply 1 application topically daily as needed (For irritation.).   Yes Historical Provider, MD  benzonatate (TESSALON) 100 MG capsule Take 1 capsule (100 mg total) by mouth every 8 (eight) hours. 11/22/16   Lysbeth Penner, FNP  clotrimazole-betamethasone (LOTRISONE) cream Apply to affected area 2 times daily prn 11/22/16   Lysbeth Penner, FNP   Meds Ordered and Administered this  Visit   Medications  acetaminophen (TYLENOL) tablet 650 mg (650 mg Oral Given 11/22/16 1611)    BP 112/73 (BP Location: Right Arm)   Pulse 98   Temp 99.7 F (37.6 C) (Oral)   LMP 10/28/2016 (Approximate)   SpO2 99%  No data found.   Physical Exam  Constitutional: She is oriented to person, place, and time. She appears well-developed and well-nourished.  HENT:  Head: Normocephalic and atraumatic.  Right Ear: External ear normal.  Left Ear: External ear normal.  Mouth/Throat: Oropharynx is clear and moist.  Eyes: Conjunctivae and EOM are normal. Pupils are equal, round, and reactive to light.  Neck: Normal range of motion. Neck supple.  Cardiovascular: Normal rate, regular rhythm and normal heart sounds.   Pulmonary/Chest: Effort normal and breath sounds normal.  Abdominal: Soft. Bowel sounds are normal.  Neurological: She is alert and oriented to person, place, and time.  Nursing note and vitals reviewed.   Urgent Care Course     Procedures (including critical care time)  Labs Review Labs Reviewed - No data to display  Imaging Review No results found.   Visual Acuity Review  Right Eye Distance:   Left Eye Distance:   Bilateral Distance:    Right Eye Near:   Left Eye Near:    Bilateral Near:         MDM   1. Acute nasopharyngitis   2. Rash  Atrovent Nasal Spray Lotrisone Cream apply bid Tylenol 650mg  one po qd        Lysbeth Penner, FNP 11/22/16 1710

## 2016-11-22 NOTE — ED Triage Notes (Signed)
Pt complains of cough, body aches and nasal congestion x3 days.  She denies any fever.

## 2016-11-24 ENCOUNTER — Telehealth: Payer: Self-pay | Admitting: Family Medicine

## 2016-11-24 NOTE — Telephone Encounter (Signed)
Needs prescription for BV called in.  [facility] on [address]

## 2016-11-24 NOTE — Telephone Encounter (Signed)
Called patient and scheduled an appointment for evaluation.  Jazmin Hartsell,CMA

## 2016-11-25 ENCOUNTER — Other Ambulatory Visit (HOSPITAL_COMMUNITY)
Admission: RE | Admit: 2016-11-25 | Discharge: 2016-11-25 | Disposition: A | Discharge status: Home / Self Care | Payer: Medicaid Other | Source: Ambulatory Visit | Attending: Family Medicine | Admitting: Family Medicine

## 2016-11-25 ENCOUNTER — Encounter: Payer: Self-pay | Admitting: Obstetrics and Gynecology

## 2016-11-25 ENCOUNTER — Ambulatory Visit (INDEPENDENT_AMBULATORY_CARE_PROVIDER_SITE_OTHER): Payer: Medicaid Other | Admitting: Obstetrics and Gynecology

## 2016-11-25 VITALS — BP 124/60 | HR 63 | Temp 98.5°F | Ht 66.0 in | Wt 213.0 lb

## 2016-11-25 DIAGNOSIS — N898 Other specified noninflammatory disorders of vagina: Secondary | ICD-10-CM

## 2016-11-25 DIAGNOSIS — N946 Dysmenorrhea, unspecified: Secondary | ICD-10-CM | POA: Diagnosis not present

## 2016-11-25 LAB — POCT WET PREP (WET MOUNT)
Clue Cells Wet Prep Whiff POC: POSITIVE
WBC, Wet Prep HPF POC: >20

## 2016-11-25 MED ORDER — METRONIDAZOLE 500 MG PO TABS: 500.0000 mg | ORAL_TABLET | Freq: Two times a day (BID) | ORAL | 0 refills | Status: DC

## 2016-11-25 NOTE — Patient Instructions (Signed)
   Take NSAIDs such as ibuprofen or aleve for menstraul pain. Take daily while on period to decrease pain. In future start 3 days prior to menstruation.   Will contact you about results  Dysmenorrhea Dysmenorrhea means painful cramps during your period (menstrual period). You will have pain in your lower belly (abdomen). The pain is caused by the tightening (contracting) of the muscles of the womb (uterus). The pain may be mild or very bad. With this condition, you may:  Have a headache.  Feel sick to your stomach (nauseous).  Throw up (vomit).  Have lower back pain. Follow these instructions at home: Helping pain and cramping   Put heat on your lower back or belly when you have pain or cramps. Use the heat source that your doctor tells you to use.  Place a towel between your skin and the heat.  Leave the heat on for 20-30 minutes.  Remove the heat if your skin turns bright red. This is especially important if you cannot feel pain, heat, or cold.  Do not have a heating pad on during sleep.  Do aerobic exercises. These include walking, swimming, or biking. These may help with cramps.  Massage your lower back or belly. This may help lessen pain. General instructions   Take over-the-counter and prescription medicines only as told by your doctor.  Do not drive or use heavy machinery while taking prescription pain medicine.  Avoid alcohol and caffeine during and right before your period. These can make cramps worse.  Do not use any products that have nicotine or tobacco. These include cigarettes and e-cigarettes. If you need help quitting, ask your doctor.  Keep all follow-up visits as told by your doctor. This is important. Contact a doctor if:  You have pain that gets worse.  You have pain that does not get better with medicine.  You have pain during sex.  You feel sick to your stomach or you throw up during your period, and medicine does not help. Get help right away  if:  You pass out (faint). Summary  Dysmenorrhea means painful cramps during your period (menstrual period).  Put heat on your lower back or belly when you have pain or cramps.  Do exercises like walking, swimming, or biking to help with cramps.  Contact a doctor if you have pain during sex. This information is not intended to replace advice given to you by your health care provider. Make sure you discuss any questions you have with your health care provider. Document Released: 10/17/2008 Document Revised: 08/07/2016 Document Reviewed: 08/07/2016 Elsevier Interactive Patient Education  2017 [employer].

## 2016-11-25 NOTE — Progress Notes (Signed)
   Subjective:   Patient ID: Krystal West, female    DOB: [birth year], 46 y.o.   MRN: [id number]  Patient presents for Same Day Appointment  Chief Complaint  Patient presents with  . Vaginal Discharge    HPI: # VAGINAL DISCHARGE Having vaginal discharge for 5 days. Medications tried: none Discharge consistency: increased amount and thin Discharge color: yellow Recent antibiotic use: no Sex in last month: no, but endorses oral intercourse  h/o trichomonas and BV  Symptoms Fever: no Dysuria:no Vaginal bleeding: just started, LMP started today Abdomen or Pelvic pain: cramping pain Back pain: no Genital sores or ulcers:no Rash: no  Review of Systems   See HPI for ROS.   History  Smoking Status  . Never Smoker  Smokeless Tobacco  . Never Used    Past medical history, surgical, family, and social history reviewed and updated in the EMR as appropriate.  Pertinent Historical Findings include: Bipolar, chronic pelvic pain Objective:  BP 124/60   Pulse 63   Temp 98.5 F (36.9 C) (Oral)   Ht 5\' 6"  (1.676 m)   Wt 213 lb (96.6 kg)   LMP 10/28/2016 (Approximate)   SpO2 99%   BMI 34.38 kg/m  Vitals and nursing note reviewed  Physical Exam  Constitutional: She is well-developed, well-nourished, and in no distress.  Cardiovascular: Normal rate.   Pulmonary/Chest: Effort normal.  Abdominal: Soft. There is no tenderness. There is no guarding.  Genitourinary: Uterus normal, cervix normal, right adnexa normal, left adnexa normal and vulva normal. Cervix exhibits no motion tenderness. Creamy  bloody and vaginal discharge found.    Results for orders placed or performed in visit on 11/25/16  POCT Wet Prep [facility])  Result Value Ref Range   Source Wet Prep POC VAG    WBC, Wet Prep HPF POC >20    Bacteria Wet Prep HPF POC Many (A) Few   Clue Cells Wet Prep HPF POC Few (A) None   Clue Cells Wet Prep Whiff POC Positive Whiff    Yeast Wet Prep HPF POC None    Trichomonas  Wet Prep HPF POC Present (A) Absent    Assessment & Plan:  1. Vaginal discharge Wet prep collected today showed Trichomonas and BV. Rx sent in for Flagyl 500 mg twice a day for 7 days to treat both. Also tested for Gc/Chlymadia testing. Patient declined HIV/RPR testing. Informed patient that she needs to tell partners so that they can be treated appropriately. Discussed safe sex practices. - POCT Wet Prep [facility]) - Cervicovaginal ancillary only  2. Dysmenorrhea Chronic history of Dysmenorrhea and chronic pelvic pain. Follows with gynecology. Had laparoscopy that was unremarkable. Did not show any causes of pelvic pain. Encouraged patient to take NSAIDs prior to menstruation and during menstruation help with dysmenorrhea.  Diagnosis and plan along with any newly prescribed medication(s) were discussed in detail with this patient today. The patient verbalized understanding and agreed with the plan.  PATIENT EDUCATION PROVIDED: See AVS   Luiz Blare, DO 11/25/2016, 9:44 AM PGY-3, [place]

## 2016-11-25 NOTE — Addendum Note (Signed)
Addended by: Katheren Shams on: 11/25/2016 03:56 PM   Modules accepted: Orders

## 2016-11-26 LAB — CERVICOVAGINAL ANCILLARY ONLY
CHLAMYDIA, DNA PROBE: NEGATIVE
Neisseria Gonorrhea: NEGATIVE

## 2016-12-19 ENCOUNTER — Ambulatory Visit (HOSPITAL_COMMUNITY)
Admission: EM | Admit: 2016-12-19 | Discharge: 2016-12-19 | Discharge status: Against Medical Advice | Payer: Medicaid Other

## 2017-01-02 ENCOUNTER — Other Ambulatory Visit: Payer: Self-pay | Admitting: Obstetrics and Gynecology

## 2017-01-02 NOTE — Telephone Encounter (Signed)
As per prior rx requests for this medication, please have her schedule an appt if she is needing an antibiotic filled.

## 2017-01-05 NOTE — Telephone Encounter (Signed)
Informed pt of below and she stated that she would call back to schedule an appointment.  Katharina Caper, April D, [place]

## 2017-07-30 ENCOUNTER — Other Ambulatory Visit (HOSPITAL_COMMUNITY)
Admission: RE | Admit: 2017-07-30 | Discharge: 2017-07-30 | Disposition: A | Discharge status: Home / Self Care | Payer: Medicaid Other | Source: Ambulatory Visit | Attending: Family Medicine | Admitting: Family Medicine

## 2017-07-30 ENCOUNTER — Ambulatory Visit: Payer: Medicaid Other | Admitting: Family Medicine

## 2017-07-30 ENCOUNTER — Encounter: Payer: Self-pay | Admitting: Family Medicine

## 2017-07-30 ENCOUNTER — Other Ambulatory Visit: Payer: Self-pay

## 2017-07-30 VITALS — BP 118/62 | HR 76 | Temp 98.3°F | Ht 66.0 in | Wt 178.0 lb

## 2017-07-30 DIAGNOSIS — R102 Pelvic and perineal pain: Secondary | ICD-10-CM

## 2017-07-30 DIAGNOSIS — N912 Amenorrhea, unspecified: Secondary | ICD-10-CM | POA: Insufficient documentation

## 2017-07-30 DIAGNOSIS — G8929 Other chronic pain: Secondary | ICD-10-CM | POA: Diagnosis not present

## 2017-07-30 DIAGNOSIS — F1210 Cannabis abuse, uncomplicated: Secondary | ICD-10-CM | POA: Diagnosis not present

## 2017-07-30 LAB — POCT URINE PREGNANCY: Preg Test, Ur: NEGATIVE

## 2017-07-30 NOTE — Patient Instructions (Signed)
It was a pleasure to see you today! Thank you for choosing [facility] for your primary care. Krystal West was seen for no menses for 3 months. Come back to the clinic if you have any new symptoms, and go to the emergency room if you have any life threatening concerns.  Today we did a pelvic exam, a pregnancy test and a ordered a number of hormonal blood tests and some tests for STIs.   We will contact you as soon as we have the results.   Please contact us if you haven't heard from anyone in 2wks to verify the results.  Your pregnancy test was negative, which indicates you are not pregnant.  If we did any lab work today, and the results require attention, either me or my nurse will get in touch with you. If everything is normal, you will get a letter in mail and a message via . If you don't hear from [place] in two weeks, please give [place] a call. Otherwise, we look forward to seeing you again at your next visit. If you have any questions or concerns before then, please call the clinic at [phone].  Please bring all your medications to every doctors visit  Sign up for My Chart to have easy access to your labs results, and communication with your Primary care physician.    Please check-out at the front desk before leaving the clinic.    Best,  Dr. Sherene Sires FAMILY MEDICINE RESIDENT - PGY1 07/30/2017 2:30 PM

## 2017-07-30 NOTE — Progress Notes (Signed)
Subjective:  Krystal West is a 46 y.o. 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  3 months ago her dysmennorhea stopped and became amennorrhea.   She still has pelvic cramping/pain but the frequency and duration have become irregular when compared to prior.   She was sexually active once with a new partner at approximately the time this started so she was concerned about being pregnant.  Multiple OTC pregnancy tests have been negative. She has not used any hormonal birth control. She has no complaints of discharge/disuria/pain with intercourse/lesions.    Objective:  Physical Exam: BP 118/62   Pulse 76   Temp 98.3 F (36.8 C) (Oral)   Ht 5\' 6"  (1.676 m)   Wt 178 lb (80.7 kg)   LMP 04/30/2017   SpO2 98%   BMI 28.73 kg/m   Gen: NAD, resting comfortably CV: RRR with slight murmur appreciated Pulm: NWOB, CTAB with no crackles, wheezes, or rhonchi GI: Normal bowel sounds present. Soft, Nontender, Nondistended. MSK: no edema, cyanosis, or clubbing noted Skin: warm, dry Neuro: grossly normal, moves all extremities Psych: Normal affect and thought content Pelvic exam: *performed with Janett Billow, CNA present for entire exam.  No lesions noted internal/external.   Small amounts of pale brownish discharge.  No cervical/adenexal tenderness on bimanual exam.   Results for orders placed or performed in visit on 07/30/17 (from the past 72 hour(s))  POCT urine pregnancy     Status: None   Collection Time: 07/30/17  1:53 PM  Result Value Ref Range   Preg Test, Ur Negative Negative  TSH     Status: None   Collection Time: 07/30/17  3:00 PM  Result Value Ref Range   TSH 1.510 0.450 - 4.500 uIU/mL  FSH/LH     Status: None   Collection Time: 07/30/17  3:00 PM  Result Value Ref Range   LH 14.5 mIU/mL    Comment:                     Adult Female:                    Follicular phase      2.4 -  12.6                       Ovulation phase      14.0 -  95.6                       Luteal phase          1.0 -  11.4                       Postmenopausal        7.7 -  58.5    FSH 12.2 mIU/mL    Comment:                     Adult Female:                       Follicular phase      3.5 -  12.5                       Ovulation  phase       4.7 -  21.5                       Luteal phase          1.7 -   7.7                       Postmenopausal       25.8 - 134.8   Prolactin     Status: None   Collection Time: 07/30/17  3:00 PM  Result Value Ref Range   Prolactin 7.8 4.8 - 23.3 ng/mL     Assessment/Plan:  Chronic pelvic pain in female Pain is continuining despite amenorhea, will order GC swab.  Pelvic exam showed some discharge but no pelvic tenderness and no visible lesions  Amenorrhea 3 months in duration, ordering LH/FSH/TSH,prolactin.   Urine pregnancy negative.  No notable weight loss/gain or other symptoms mentioned  Marijuana abuse Patient counseled to stop using marijuana.  She declines.   Sherene Sires, DO FAMILY MEDICINE RESIDENT - PGY1 07/31/2017 2:24 PM

## 2017-07-30 NOTE — Assessment & Plan Note (Signed)
3 months in duration, ordering LH/FSH/TSH,prolactin.   Urine pregnancy negative.  No notable weight loss/gain or other symptoms mentioned

## 2017-07-30 NOTE — Assessment & Plan Note (Addendum)
Pain is continuining despite amenorhea, will order GC swab.  Pelvic exam showed some discharge but no pelvic tenderness and no visible lesions

## 2017-07-31 DIAGNOSIS — F1210 Cannabis abuse, uncomplicated: Secondary | ICD-10-CM | POA: Insufficient documentation

## 2017-07-31 LAB — FSH/LH
FSH: 12.2 m[IU]/mL
LH: 14.5 m[IU]/mL

## 2017-07-31 LAB — CERVICOVAGINAL ANCILLARY ONLY
Chlamydia: NEGATIVE
NEISSERIA GONORRHEA: NEGATIVE

## 2017-07-31 LAB — PROLACTIN: Prolactin: 7.8 ng/mL (ref 4.8–23.3)

## 2017-07-31 LAB — TSH: TSH: 1.51 u[IU]/mL (ref 0.450–4.500)

## 2017-07-31 NOTE — Assessment & Plan Note (Signed)
Patient counseled to stop using marijuana.  She declines.

## 2017-11-17 ENCOUNTER — Ambulatory Visit: Payer: Medicaid Other | Admitting: Student

## 2017-11-17 ENCOUNTER — Encounter: Payer: Self-pay | Admitting: Student

## 2017-11-17 ENCOUNTER — Other Ambulatory Visit: Payer: Self-pay

## 2017-11-17 VITALS — BP 110/68 | HR 60 | Temp 98.2°F | Ht 66.0 in | Wt 164.4 lb

## 2017-11-17 DIAGNOSIS — M79602 Pain in left arm: Secondary | ICD-10-CM

## 2017-11-17 DIAGNOSIS — F3170 Bipolar disorder, currently in remission, most recent episode unspecified: Secondary | ICD-10-CM | POA: Diagnosis not present

## 2017-11-17 MED ORDER — AMITRIPTYLINE HCL 50 MG PO TABS: 50.0000 mg | ORAL_TABLET | Freq: Every day | ORAL | 0 refills | Status: DC

## 2017-11-17 MED ORDER — BACLOFEN 10 MG PO TABS: 10.0000 mg | ORAL_TABLET | Freq: Every evening | ORAL | 0 refills | Status: DC | PRN

## 2017-11-17 NOTE — Patient Instructions (Signed)
It was great seeing you today! We have addressed the following issues today  You shoulder pain is likely due to rotator cuff inflammation/irritation.  I still recommend taking you Aleve or naproxen twice a day.  We sent a prescription for baclofen to your pharmacy.  You can also continue taking your amitriptyline to help with the numbness.  Try stretching you left shoulder as we discussed in the office.  You may return if no improvement over the next 2 weeks.   If we did any lab work today, and the results require attention, either me or my nurse will get in touch with you. If everything is normal, you will get a letter in mail and a message via . If you don't hear from [place] in two weeks, please give [place] a call. Otherwise, we look forward to seeing you again at your next visit. If you have any questions or concerns before then, please call the clinic at [phone].  Please bring all your medications to every doctors visit  Sign up for My Chart to have easy access to your labs results, and communication with your Primary care physician.    Please check-out at the front desk before leaving the clinic.    Take Care,   Dr. Cyndia Skeeters

## 2017-11-17 NOTE — Progress Notes (Signed)
Subjective:    Krystal West is a 47 y.o. old female here for tingling in the fingers of her left hand and left shoulder pain.   HPI   Tingling in fingers She first noticed a tingling sensation in the distal 1/3 of the middle finger of her left hand about 2 weeks ago. Over the past week, the tingling has extended to the middle 1/3 of her middle finger and tip of her ring finger and index finger. She says that it feels like her fingers fell sleep ("pins and needles" sensation) but will not wake up. She denies any injury to her hand or wrist or history of repetitive motions with her left arm. She denies any loss of strength or associated pain in her hand or fingers.  Left shoulder pain Pain started about 2 weeks ago, just after she noticed the tingling sensation in her fingers described above. She describes the pain as achy and it seems to be worse with activity and in cold environments. She rates the pain as an 8/10 in severity and has not been sleeping well due to the pain keeping her up at night. She has tried Aleve, ibuprofen, and topical medications to alleviate the pain, but she has found minimal relief with these. She denies loss of strength or associated neck pain.  PMH/Problem List: has Bipolar disorder ([place]); Chronic pelvic pain in female; Contraception; Dysmenorrhea; Vaginal discharge; Amenorrhea; and Marijuana abuse on their problem list.   has a past medical history of Anxiety (2004), Bipolar disorder ([place]) (2004), Depression (2004), GERD (gastroesophageal reflux disease), Hypertension, Irregular heart beats, Post traumatic stress disorder (PTSD) (2004), and Tendonitis.  FH:  Family History  Problem Relation Age of Onset  . Hypertension Mother   . Hypertension Father   . Heart disease Father   . Schizophrenia Brother   . Multiple sclerosis Brother   . Schizophrenia Brother   . Cancer Maternal Grandmother        breast ca  . Stroke Neg Hx   . Diabetes Neg Hx     SH Social  History   Tobacco Use  . Smoking status: Never Smoker  . Smokeless tobacco: Never Used  Substance Use Topics  . Alcohol use: Yes    Alcohol/week: 0.0 oz    Types: 1 - 2 Shots of liquor per week    Comment: history no longer drinking  . Drug use: Yes    Types: Cocaine, Marijuana    Comment: DENIES AT THIS TIME    Review of Systems Review of systems negative except for pertinent positives and negatives in history of present illness above.     Objective:     Vitals:   11/17/17 1544  BP: 110/68  Pulse: 60  Temp: 98.2 F (36.8 C)  TempSrc: Oral  SpO2: 99%  Weight: 164 lb 6.4 oz (74.6 kg)  Height: 5\' 6"  (1.676 m)   Body mass index is 26.53 kg/m.  Physical Exam  GEN: appears well & comfortable. No apparent distress. Head: normocephalic and atraumatic  CVS: RRR, nl s1 & s2, no murmurs, no edema,  2+ radial pulses bilaterally, cap refills brisk RESP: no IWOB, good air movement bilaterally, CTAB MSK: tenderness around acromion of left shoulder; full ROM in shoulders bilaterally; 5/5 strength with shoulder abduction/adduction, elbow flexion/extension and 5/5 grip strength; negative Tinel's sign  SKIN: no apparent skin lesion NEURO: alert and oiented appropriately, no gross deficits   PSYCH: euthymic mood with congruent affect     Assessment and Plan:  Left shoulder pain Achy quality of pain that worsens with activity and associated tenderness over tendons of rotator cuff make rotator cuff tendonitis the most likely cause of patient's shoulder pain. -Started baclofen; cautioned patient about drowsiness and sedation that can occur with this medication and recommended taking it at night before bed -Recommended taking Aleve/naproxen 2x per day for 5-7 days to help reduce inflammation that is likely contributing to pain -Recommended gentle stretching of left shoulder -Advised to return if symptoms do not improve over next 2 weeks  Tingling in fingers Given similar timeline  of onset of finger tingling with onset of shoulder pain, finger tingling may be secondary to nerve impingement at shoulder caused by rotator cuff inflammation. Negative Tinel's sign, normal strength in left hand, and normal sensation of thumb make carpal tunnel less likely. - Treating rotator cuff tendonitis as outlined above may improve finger symptoms - Recommended taking amitriptyline (already on medication list) to help with tingling in fingers   Philomena Course, Medical Student 11/17/17 Pager: [phone]

## 2017-11-23 DIAGNOSIS — M79602 Pain in left arm: Secondary | ICD-10-CM | POA: Insufficient documentation

## 2018-01-08 ENCOUNTER — Encounter: Payer: Self-pay | Admitting: Neurology

## 2018-01-12 ENCOUNTER — Ambulatory Visit (INDEPENDENT_AMBULATORY_CARE_PROVIDER_SITE_OTHER): Payer: Medicaid Other | Admitting: Neurology

## 2018-01-12 ENCOUNTER — Encounter: Payer: Self-pay | Admitting: Neurology

## 2018-01-12 VITALS — BP 100/64 | HR 78 | Ht 66.0 in | Wt 167.0 lb

## 2018-01-12 DIAGNOSIS — E538 Deficiency of other specified B group vitamins: Secondary | ICD-10-CM

## 2018-01-12 DIAGNOSIS — R202 Paresthesia of skin: Secondary | ICD-10-CM | POA: Diagnosis not present

## 2018-01-12 DIAGNOSIS — R200 Anesthesia of skin: Secondary | ICD-10-CM | POA: Diagnosis not present

## 2018-01-12 NOTE — Patient Instructions (Signed)
Return to clinic as needed

## 2018-01-12 NOTE — Progress Notes (Signed)
[facility] Clinic Note - Initial Visit   Date: 01/12/18  Krystal West MRN: [id number] DOB: Dec 21, 1970   Dear Dr. Criss Rosales:  Thank you for your kind referral of Krystal West for consultation of left hand tingling/numbness. Although her history is well known to you, please allow [place] to reiterate it for the purpose of our medical record. The patient was accompanied to the clinic by self.   History of Present Illness: Krystal West is a 47 y.o. right-handed African American female with bipolar depression, GERD, PTSD, and anemia presenting for evaluation of left hand numbness/tingling.    Starting around March 2019, she developed numbness of the middle finger, ring finger, and index.  Symptoms were constant and interfered with fine motor tasks.  She denies any weakness of hand or neck pain.  Recently, she was found to have iron deficient anemia and vitamin B12 deficiency.  She started B-complex and iron supplements about a month ago, which has significantly helped her symptoms.  She no longer has any numbness or tingling.     Past Medical History:  Diagnosis Date  . Anxiety 2004  . Bipolar disorder ([place]) 2004  . Depression 2004  . GERD (gastroesophageal reflux disease)   . Hypertension    patient denies  . Irregular heart beats    occ  . Post traumatic stress disorder (PTSD) 2004  . Tendonitis    right hand, shoulder    Past Surgical History:  Procedure Laterality Date  . LAPAROSCOPY N/A 03/04/2016   Procedure: LAPAROSCOPY DIAGNOSTIC;  Surgeon: Emily Filbert, MD;  Location: [facility];  Service: Gynecology;  Laterality: N/A;  . TUBAL LIGATION  1995     Medications:  Outpatient Encounter Medications as of 01/12/2018  Medication Sig Note  . clonazePAM (KLONOPIN) 1 MG tablet Take 1 tablet (1 mg total) by mouth 2 (two) times daily.   . clotrimazole-betamethasone (LOTRISONE) cream Apply to affected area 2 times daily prn   . hydrocortisone cream 1 % Apply 1  application topically daily as needed for itching. 03/04/2016: Rt groin  . hydrOXYzine (ATARAX/VISTARIL) 25 MG tablet Take 25 mg by mouth 3 (three) times daily as needed for anxiety.    . Multiple Vitamin (MULTIVITAMIN WITH MINERALS) TABS tablet Take 1 tablet by mouth daily.   . polyethylene glycol powder (GLYCOLAX/MIRALAX) powder Take 17 g by mouth daily as needed (Constipation.).   Marland Kitchen triamcinolone cream (KENALOG) 0.1 % Apply 1 application topically daily as needed (For irritation.).   . [DISCONTINUED] amitriptyline (ELAVIL) 50 MG tablet Take 1 tablet (50 mg total) by mouth at bedtime.   . [DISCONTINUED] baclofen (LIORESAL) 10 MG tablet Take 1 tablet (10 mg total) by mouth at bedtime as needed for muscle spasms.   . [DISCONTINUED] benzonatate (TESSALON) 100 MG capsule Take 1 capsule (100 mg total) by mouth every 8 (eight) hours.   . [DISCONTINUED] fluticasone (FLONASE) 50 MCG/ACT nasal spray Place 2 sprays into the nose daily. (Patient not taking: Reported on 04/10/2015)   . [DISCONTINUED] ipratropium (ATROVENT) 0.06 % nasal spray Place 2 sprays into both nostrils 4 (four) times daily.   . [DISCONTINUED] lurasidone (LATUDA) 40 MG TABS tablet Take 40 mg by mouth daily with breakfast.   . [DISCONTINUED] metroNIDAZOLE (FLAGYL) 500 MG tablet Take 1 tablet (500 mg total) by mouth 2 (two) times daily.   . [DISCONTINUED] omeprazole (PRILOSEC) 20 MG capsule take 1 capsule by mouth once daily   . [DISCONTINUED] predniSONE (DELTASONE) 20 MG tablet Two daily with  food    No facility-administered encounter medications on file as of 01/12/2018.      Allergies: No Known Allergies  Family History: Family History  Problem Relation Age of Onset  . Hypertension Mother   . Hypertension Father   . Heart disease Father   . Schizophrenia Brother   . Multiple sclerosis Brother   . Schizophrenia Brother   . Cancer Maternal Grandmother        breast ca  . Stroke Neg Hx   . Diabetes Neg Hx     Social  History: Social History   Tobacco Use  . Smoking status: Never Smoker  . Smokeless tobacco: Never Used  Substance Use Topics  . Alcohol use: Yes    Alcohol/week: 0.0 oz    Types: 1 - 2 Shots of liquor per week    Comment: history no longer drinking  . Drug use: Yes    Types: Cocaine, Marijuana    Comment: DENIES AT THIS TIME   Social History   Social History Narrative   Unemployed, applying for disability.  Graduated from high school, some college.  History of clerical jobs, Glass blower/designer.  Lives with 4 children (ages daughter age 49, boys 16-19)    Review of Systems:  CONSTITUTIONAL: No fevers, chills, night sweats, or weight loss.   EYES: No visual changes or eye pain ENT: No hearing changes.  No history of nose bleeds.   RESPIRATORY: No cough, wheezing and shortness of breath.   CARDIOVASCULAR: Negative for chest pain, and palpitations.   GI: Negative for abdominal discomfort, blood in stools or black stools.  No recent change in bowel habits.   GU:  No history of incontinence.   MUSCLOSKELETAL: No history of joint pain or swelling.  No myalgias.   SKIN: Negative for lesions, rash, and itching.   HEMATOLOGY/ONCOLOGY: Negative for prolonged bleeding, bruising easily, and swollen nodes.  No history of cancer.   ENDOCRINE: Negative for cold or heat intolerance, polydipsia or goiter.   PSYCH:  +depression or anxiety symptoms.   NEURO: As Above.   Vital Signs:  BP 100/64   Pulse 78   Ht 5\' 6"  (1.676 m)   Wt 167 lb (75.8 kg)   SpO2 97%   BMI 26.95 kg/m    General Medical Exam:   General:  Well appearing, comfortable.   Eyes/ENT: see cranial nerve examination.   Neck: No masses appreciated.  Full range of motion without tenderness.  No carotid bruits. Respiratory:  Clear to auscultation, good air entry bilaterally.   Cardiac:  Regular rate and rhythm, no murmur.   Extremities:  No deformities, edema, or skin discoloration.  Skin:  No rashes or  lesions.  Neurological Exam: MENTAL STATUS including orientation to time, place, person, recent and remote memory, attention span and concentration, language, and fund of knowledge is normal.  Speech is not dysarthric.  CRANIAL NERVES: II:  No visual field defects.  III-IV-VI: Pupils equal round and reactive to light.  Normal conjugate, extra-ocular eye movements in all directions of gaze.  No nystagmus.  No ptosis.   V:  Normal facial sensation.   VII:  Normal facial symmetry and movements.  VIII:  Normal hearing and vestibular function.   IX-X:  Normal palatal movement.   XI:  Normal shoulder shrug and head rotation.   XII:  Normal tongue strength and range of motion, no deviation or fasciculation.  MOTOR:  Motor strength is 5/5 throughout, including bilateral ABP.   No atrophy,  fasciculations or abnormal movements.  No pronator drift.  Tone is normal.    MSRs:  Right                                                                 Left brachioradialis 2+  brachioradialis 2+  biceps 2+  biceps 2+  triceps 2+  triceps 2+  patellar 2+  patellar 2+  ankle jerk 2+  ankle jerk 2+  Hoffman no  Hoffman no  plantar response down  plantar response down   SENSORY:  Normal and symmetric perception of light touch, pinprick, vibration.  COORDINATION/GAIT: Normal finger-to- nose-finger.  Intact rapid alternating movements bilaterally.  Gait narrow based and stable. Tandem and stressed gait intact.    IMPRESSION: Left hand paresthesia due to vitamin B12 deficiency which has resolved since starting supplementation. Her neurological exam is normal and non-focal.  No signs of nerve entrapment, such as carpal tunnel syndrome.  Reassurance provided. Recommend continuing supplementation.  If she develops new symptoms or weakness, she can return for NCS/EMG.   Thank you for allowing me to participate in patient's care.  If I can answer any additional questions, I would be pleased to do so.     Sincerely,    Donika K. Posey Pronto, DO

## 2018-04-04 IMAGING — DX DG CHEST 2V
2 series · 2 of 2 positions shown · non-contrast
Comparison: August 21, 2009

CLINICAL DATA: Cough and tachycardia

EXAM:
CHEST  2 VIEW

[w chest pa]
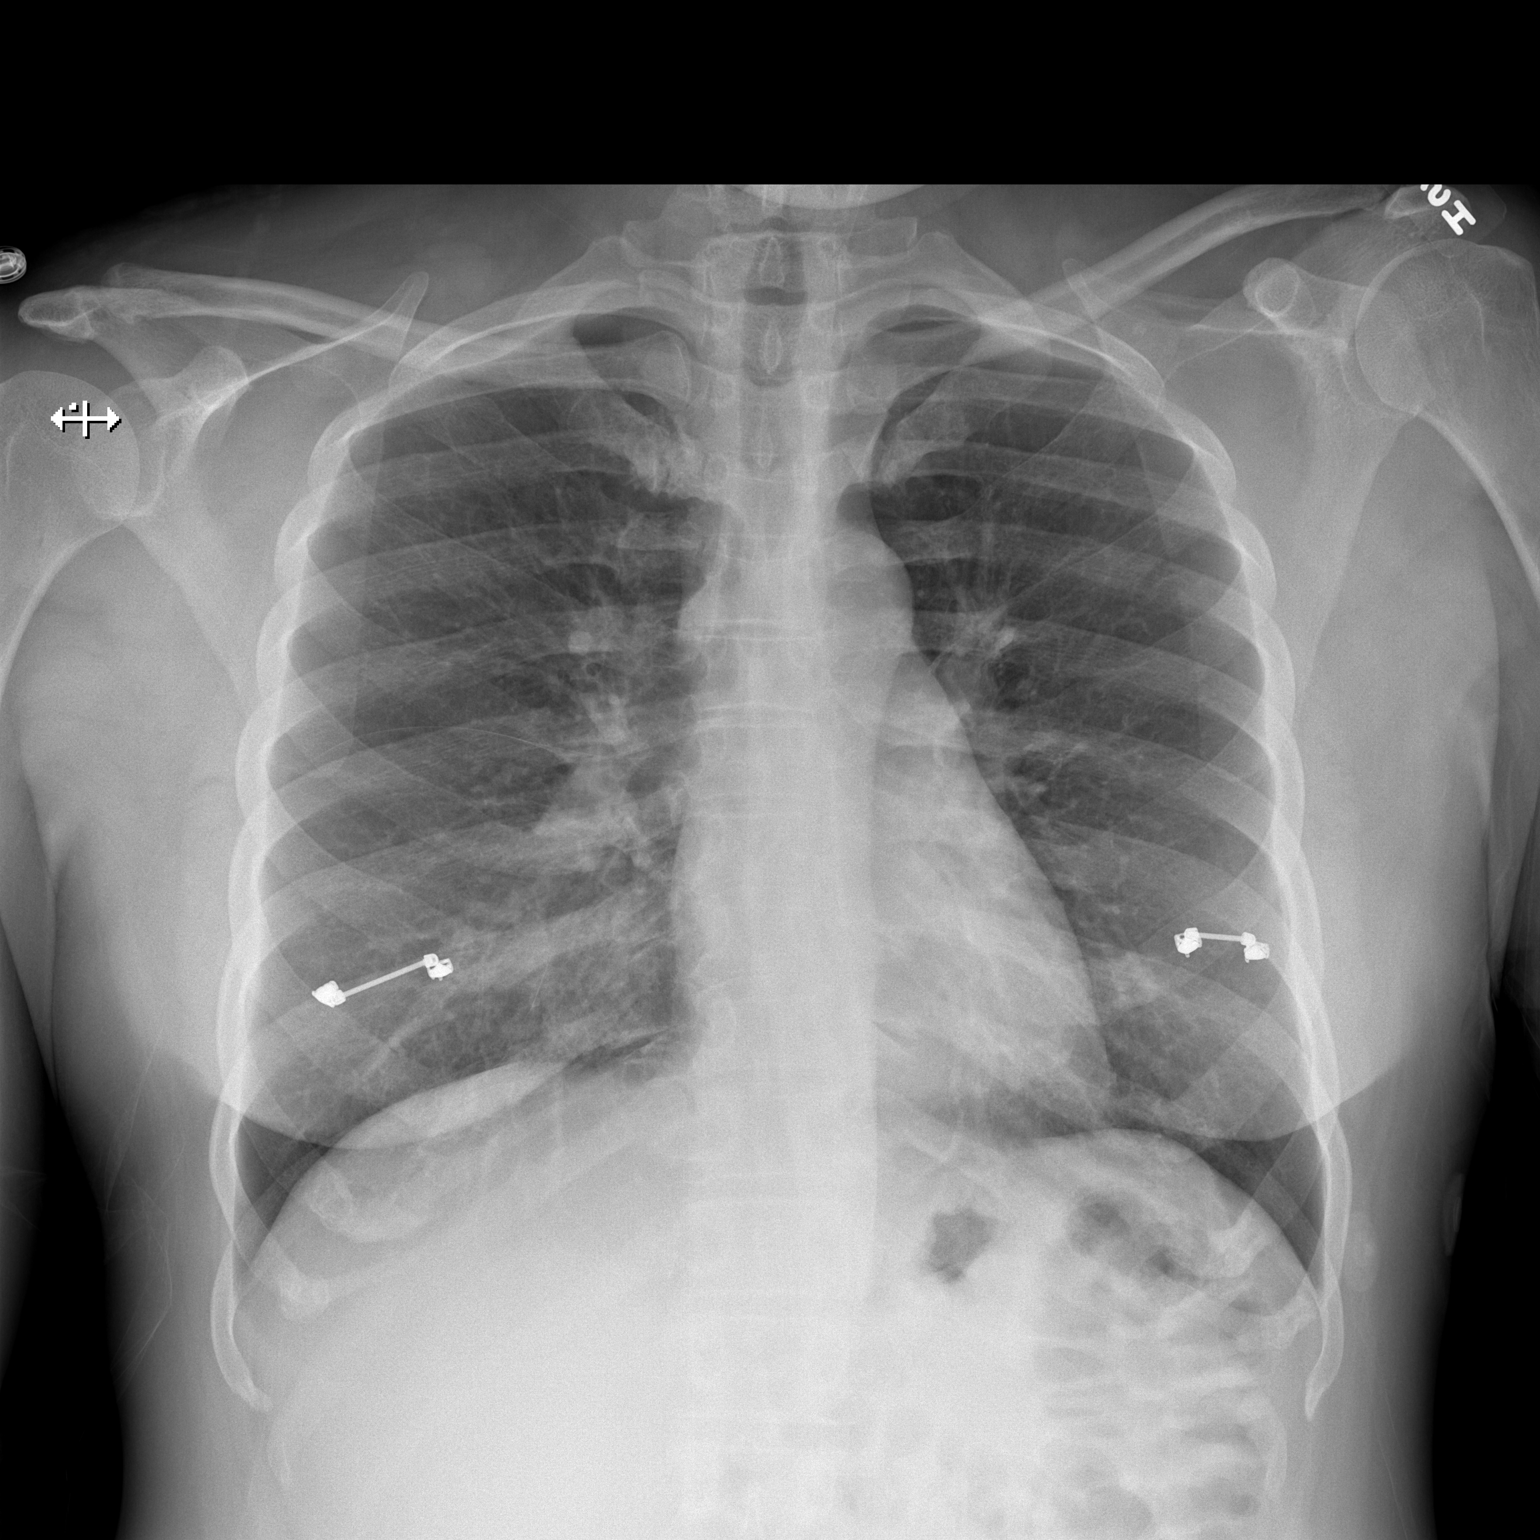

[w chest lat]
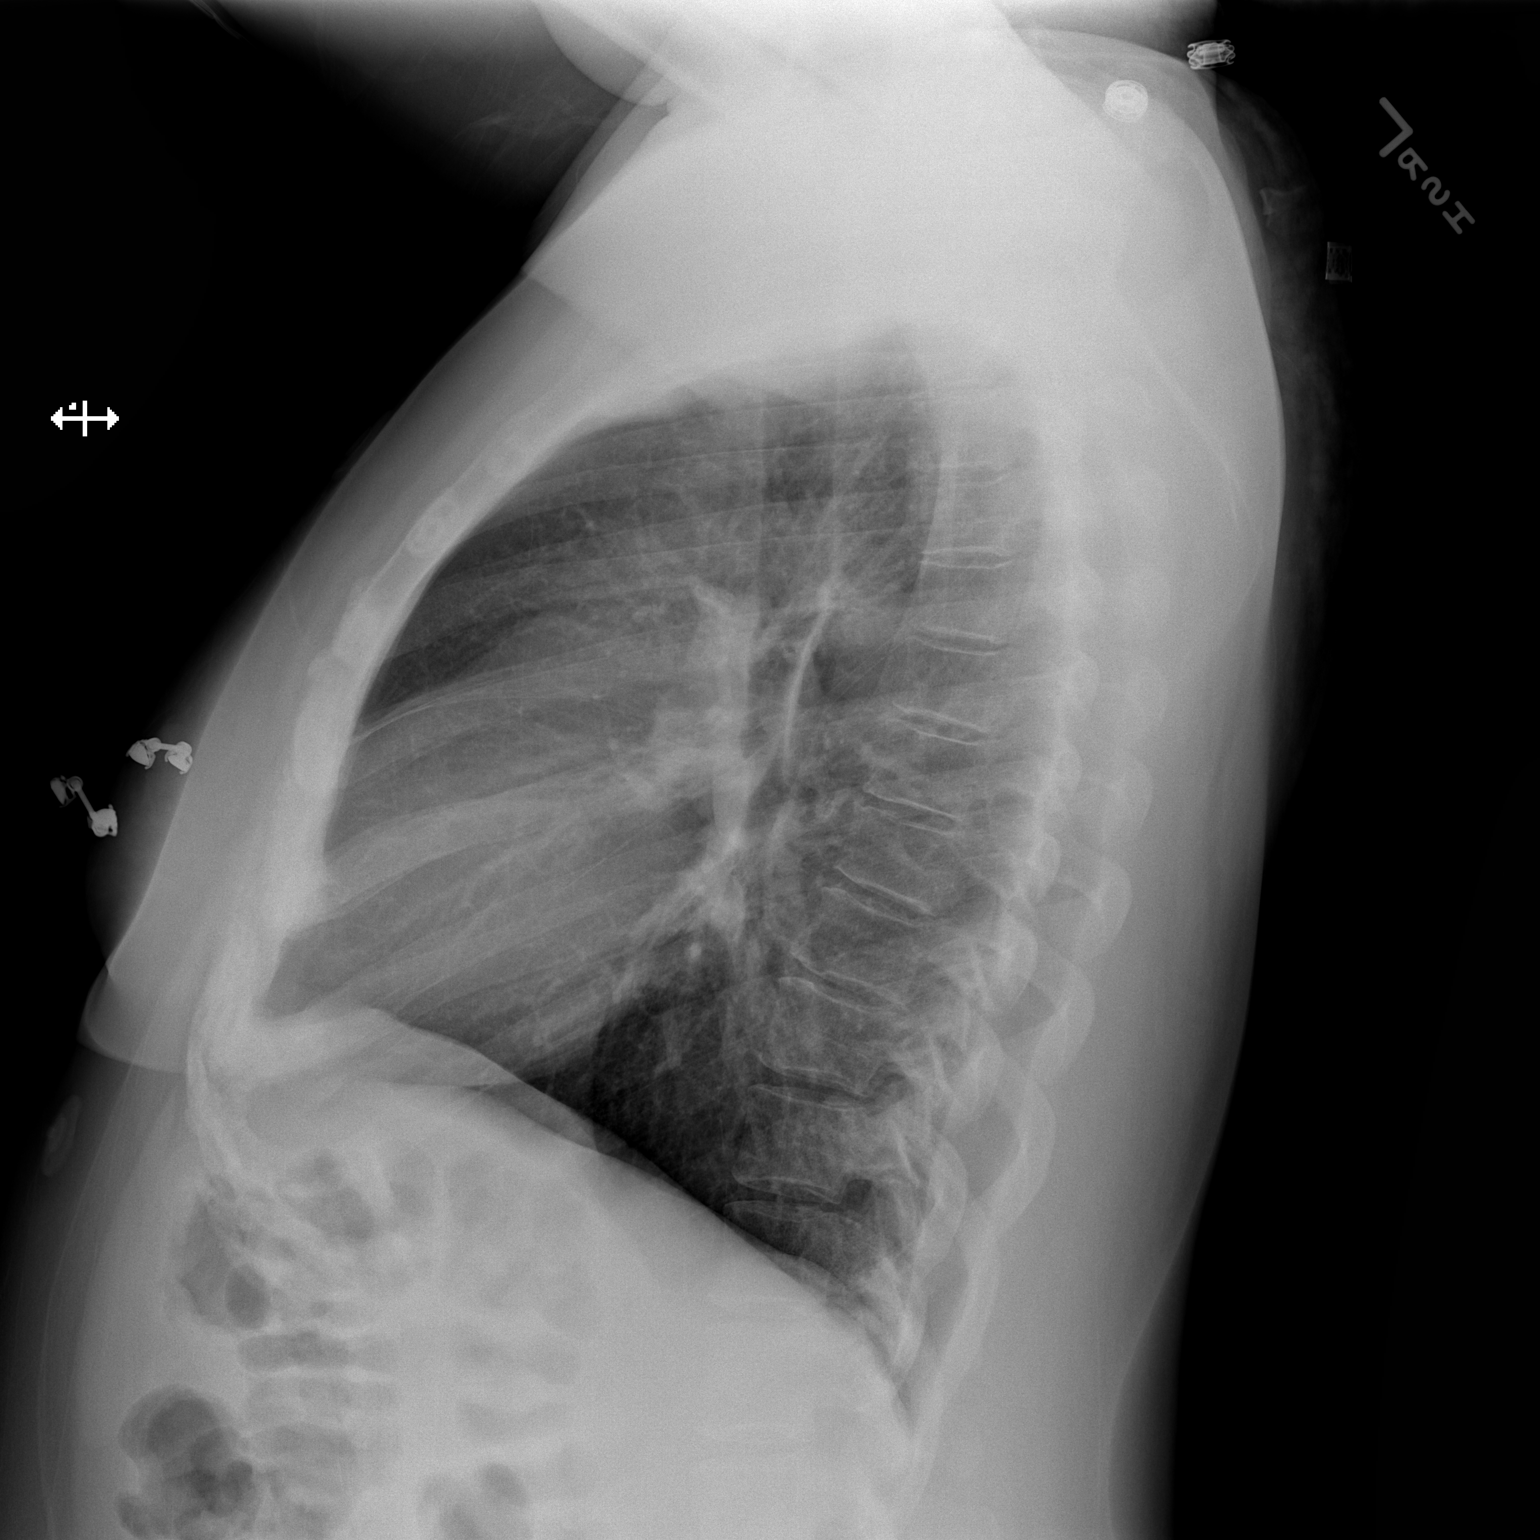

[2 of 2 positions shown; findings below may reference images not displayed]

FINDINGS: There is no edema or consolidation. The heart size and pulmonary
vascularity are normal. No adenopathy. No bone lesions.
IMPRESSION: No edema or consolidation.

## 2018-04-19 ENCOUNTER — Encounter

## 2018-04-19 ENCOUNTER — Ambulatory Visit: Payer: Medicaid Other | Admitting: Neurology

## 2018-05-26 ENCOUNTER — Other Ambulatory Visit (HOSPITAL_COMMUNITY)
Admission: RE | Admit: 2018-05-26 | Discharge: 2018-05-26 | Disposition: A | Discharge status: Home / Self Care | Payer: Medicaid Other | Source: Ambulatory Visit | Attending: Family Medicine | Admitting: Family Medicine

## 2018-05-26 ENCOUNTER — Other Ambulatory Visit: Payer: Self-pay | Admitting: Family Medicine

## 2018-05-26 DIAGNOSIS — Z01411 Encounter for gynecological examination (general) (routine) with abnormal findings: Secondary | ICD-10-CM | POA: Diagnosis not present

## 2018-06-03 LAB — CYTOLOGY - PAP
Adequacy: ABSENT
DIAGNOSIS: NEGATIVE
HPV 16/18/45 GENOTYPING: NEGATIVE
HPV: DETECTED — AB

## 2019-08-24 ENCOUNTER — Other Ambulatory Visit: Payer: Self-pay

## 2019-08-24 ENCOUNTER — Ambulatory Visit (INDEPENDENT_AMBULATORY_CARE_PROVIDER_SITE_OTHER): Payer: Medicaid Other | Admitting: Family Medicine

## 2019-08-24 ENCOUNTER — Encounter: Payer: Self-pay | Admitting: Family Medicine

## 2019-08-24 ENCOUNTER — Other Ambulatory Visit (HOSPITAL_COMMUNITY)
Admission: RE | Admit: 2019-08-24 | Discharge: 2019-08-24 | Disposition: A | Discharge status: Home / Self Care | Payer: Medicaid Other | Source: Ambulatory Visit | Attending: Family Medicine | Admitting: Family Medicine

## 2019-08-24 VITALS — BP 100/60 | HR 73 | Ht 66.0 in | Wt 207.2 lb

## 2019-08-24 DIAGNOSIS — N898 Other specified noninflammatory disorders of vagina: Secondary | ICD-10-CM | POA: Diagnosis present

## 2019-08-24 LAB — POCT WET PREP (WET MOUNT): Clue Cells Wet Prep Whiff POC: NEGATIVE

## 2019-08-24 MED ORDER — METRONIDAZOLE 500 MG PO TABS: 500.0000 mg | ORAL_TABLET | Freq: Two times a day (BID) | ORAL | 0 refills | Status: AC

## 2019-08-24 NOTE — Patient Instructions (Signed)
It was a pleasure meeting you.  You were seen for STI testing  I have ordered Flagyl 500mg  to take 1 tablet twice a day.  I will call you with your lab results if they are abnormal.  Follow up with your PCP as needed  Have a great day Krystal Leitz MD   Safe Sex Practicing safe sex means taking steps before and during sex to reduce your risk of:  Getting an STI (sexually transmitted infection).  Giving your partner an STI.  Unwanted or unplanned pregnancy. How can I practice safe sex?     Ways you can practice safe sex  Limit your sexual partners to only one partner who is having sex with only you.  Avoid using alcohol and drugs before having sex. Alcohol and drugs can affect your judgment.  Before having sex with a new partner: ? Talk to your partner about past partners, past STIs, and drug use. ? Get screened for STIs and discuss the results with your partner. Ask your partner to get screened, too.  Check your body regularly for sores, blisters, rashes, or unusual discharge. If you notice any of these problems, visit your health care provider.  Avoid sexual contact if you have symptoms of an infection or you are being treated for an STI.  While having sex, use a condom. Make sure to: ? Use a condom every time you have vaginal, oral, or anal sex. Both females and males should wear condoms during oral sex. ? Keep condoms in place from the beginning to the end of sexual activity. ? Use a latex condom, if possible. Latex condoms offer the best protection. ? Use only water-based lubricants with a condom. Using petroleum-based lubricants or oils will weaken the condom and increase the chance that it will break. Ways your health care provider can help you practice safe sex  See your health care provider for regular screenings, exams, and tests for STIs.  Talk with your health care provider about what kind of birth control (contraception) is best for you.  Get vaccinated  against hepatitis B and human papillomavirus (HPV).  If you are at risk of being infected with HIV (human immunodeficiency virus), talk with your health care provider about taking a prescription medicine to prevent HIV infection. You are at risk for HIV if you: ? Are a man who has sex with other men. ? Are sexually active with more than one partner. ? Take drugs by injection. ? Have a sex partner who has HIV. ? Have unprotected sex. ? Have sex with someone who has sex with both men and women. ? Have had an STI. Follow these instructions at home:  Take over-the-counter and prescription medicines as told by your health care provider.  Keep all follow-up visits as told by your health care provider. This is important. Where to find more information  Centers for Disease Control and Prevention: PinkCheek.be  Planned Parenthood: https://www.plannedparenthood.org/  Office on Women's Health: BasketballVoice.it Summary  Practicing safe sex means taking steps before and during sex to reduce your risk of STIs, giving your partner STIs, and having an unwanted or unplanned pregnancy.  Before having sex with a new partner, talk to your partner about past partners, past STIs, and drug use.  Use a condom every time you have vaginal, oral, or anal sex. Both females and males should wear condoms during oral sex.  Check your body regularly for sores, blisters, rashes, or unusual discharge. If you notice any of these problems,  visit your health care provider.  See your health care provider for regular screenings, exams, and tests for STIs. This information is not intended to replace advice given to you by your health care provider. Make sure you discuss any questions you have with your health care provider. Document Revised: 11/12/2018 Document Reviewed: 05/03/2018 Elsevier Patient Education  [employer].

## 2019-08-24 NOTE — Progress Notes (Signed)
  Patient Name: Krystal West Date of Birth: [birth year] Date of Visit: 08/24/19 PCP: Kathrene Alu, MD  Chief Complaint:   Subjective: Krystal West is a pleasant 49 y.o. with medical history significant for HTN,  presenting today for STI testing.   Patient reports having unprotected sexual intercourse about 1 month ago.  Her sexual partner recently contacted her to tell her he was treated for Trichomonas.  Patients reports that she ahs been having brown foul smelling vaginal discharge for approximately 2 weeks.  She also reports some mild abdominal tenderness.  Denies any dysuria, back pain, fevers or abnormal vaginal bleeding. She is just finishing her menstrual cycle so she denies pregnancy.  She reports 2 sexual partners in the past year.  ROS: Per HPI.   I have reviewed the patient's medical, surgical, family, and social history as appropriate.  Vitals:   08/24/19 1439  BP: 100/60  Pulse: 73  SpO2: 99%   Vaginal Exam: no lesions, abrasions noted externally, bimanual exam negative for adnexal tenderness or cervical motion tenderness SVE: vaginal wall mucosa normal, copious amount of yellow fluid noted in vaginal vault, cervix friable Abdominal Exam: soft, non tender, non distended, BS present, no CVA tenderness  Vaginal discharge Given recent unprotected sexual intercourse and partner positive for Trichomonas, high suspicion of STI causing vaginal discharge -GC/C -wet mount positive for Trichomonas -HIV and RPR -Flagyl 500mg  twice daily for 7 days -Encouraged condom use -Follow up with PCP as needed  Carollee Leitz, MD  [facility] Medicine Teaching Service

## 2019-08-25 LAB — CERVICOVAGINAL ANCILLARY ONLY
Chlamydia: NEGATIVE
Comment: NEGATIVE
Comment: NORMAL
Neisseria Gonorrhea: NEGATIVE

## 2019-08-25 LAB — HIV ANTIBODY (ROUTINE TESTING W REFLEX): HIV Screen 4th Generation wRfx: NONREACTIVE

## 2019-08-26 ENCOUNTER — Encounter: Payer: Self-pay | Admitting: Family Medicine

## 2019-08-26 NOTE — Assessment & Plan Note (Signed)
Given recent unprotected sexual intercourse and partner positive for Trichomonas, high suspicion of STI causing vaginal discharge -GC/C -wet mount positive for Trichomonas -HIV and RPR -Flagyl 500mg  twice daily for 7 days -Encouraged condom use -Follow up with PCP as needed

## 2019-08-30 LAB — RPR W/REFLEX TO TREPSURE: RPR: NONREACTIVE

## 2019-08-30 LAB — T PALLIDUM ANTIBODY, EIA: T pallidum Antibody, EIA: NEGATIVE

## 2019-11-01 ENCOUNTER — Other Ambulatory Visit: Payer: Self-pay | Admitting: Family Medicine

## 2022-01-07 ENCOUNTER — Encounter: Payer: Self-pay | Admitting: *Deleted

## 2023-12-22 ENCOUNTER — Ambulatory Visit (INDEPENDENT_AMBULATORY_CARE_PROVIDER_SITE_OTHER): Admitting: Student

## 2023-12-22 ENCOUNTER — Other Ambulatory Visit (HOSPITAL_COMMUNITY)
Admission: RE | Admit: 2023-12-22 | Discharge: 2023-12-22 | Disposition: A | Discharge status: Home / Self Care | Source: Ambulatory Visit | Attending: Family Medicine | Admitting: Family Medicine

## 2023-12-22 VITALS — BP 112/75 | HR 98 | Ht 65.0 in | Wt 205.0 lb

## 2023-12-22 DIAGNOSIS — Z124 Encounter for screening for malignant neoplasm of cervix: Secondary | ICD-10-CM | POA: Diagnosis present

## 2023-12-22 DIAGNOSIS — N950 Postmenopausal bleeding: Secondary | ICD-10-CM | POA: Diagnosis not present

## 2023-12-22 DIAGNOSIS — N951 Menopausal and female climacteric states: Secondary | ICD-10-CM

## 2023-12-22 DIAGNOSIS — N898 Other specified noninflammatory disorders of vagina: Secondary | ICD-10-CM | POA: Diagnosis present

## 2023-12-22 LAB — POCT WET PREP (WET MOUNT)
Clue Cells Wet Prep Whiff POC: POSITIVE
WBC, Wet Prep HPF POC: >20

## 2023-12-22 MED ORDER — VEOZAH 45 MG PO TABS: 45.0000 mg | ORAL_TABLET | Freq: Every day | ORAL | 3 refills | Status: DC

## 2023-12-22 NOTE — Progress Notes (Signed)
    SUBJECTIVE:   CHIEF COMPLAINT / HPI:   Vaginal Discharge: Patient is a 53 y.o. female presenting with vaginal discharge for 7 days.  She states the discharge is of runny consistency.  She endorses vaginal odor.  Tried using OTC cream at [facility] and symptoms did not resolve. She is interested in screening for sexually transmitted infections today.  LMP: 4 years ago. Has intense hot flashes with menopause. Reports intermittent abnormal bleeding. Taking Gabapentin 600 mg BID and Vraylar and Klonipin.  HCM: Last pap cytology in 2019 with HPV (negative for 16,18) and she is due today.   PERTINENT  PMH / PSH: None relevant  OBJECTIVE:   BP 112/75   Pulse 98   Ht 5\' 5"  (1.651 m)   Wt 205 lb (93 kg)   SpO2 100%   BMI 34.11 kg/m    General: NAD, pleasant, able to participate in exam, wearing neck fan Respiratory: Normal effort, no obvious respiratory distress Pelvic: VULVA: normal appearing vulva with no masses, tenderness or lesions, VAGINA: Normal appearing vagina with normal color, no lesions, with copious, white, and yellow discharge present. CERVIX: No lesions, non friable.  Chaperone Inis Mani, CMA present for pelvic exam  ASSESSMENT/PLAN:   Post-menopausal bleeding Pelvic US  w/ transvaginal scheduled F/u results Set up in colpo clinic for endometrial biopsy- discussed with patient risk/benefits    Assessment:  53 y.o. female with vaginal discharge for 7 days, as well as odor.  Physical exam significant for copious white, yellow discharge.  Wet prep performed today shows  trichomonas.   Plan: -Cytology pap collected today. Reviewed prior. -Wet prep as above.  Will treat with metronidazole . Discussed partners needing treatment, condoms, etc. TOC in 1 month -GC/chlamydia pending   Vallorie Gayer, DO [facility]

## 2023-12-22 NOTE — Assessment & Plan Note (Signed)
 Pelvic US  w/ transvaginal scheduled F/u results Set up in colpo clinic for endometrial biopsy- discussed with patient risk/benefits

## 2023-12-22 NOTE — Patient Instructions (Addendum)
 It was great seeing you today.  As we discussed, - Testing was completed today. I will call if anything is abnormal. - Please go get your ultrasound at the scheduled time   If you have any questions or concerns, please feel free to call the clinic.   Have a wonderful day,  Dr. Vallorie Gayer [facility] [phone]

## 2023-12-24 ENCOUNTER — Ambulatory Visit: Payer: Self-pay | Admitting: Student

## 2023-12-24 ENCOUNTER — Other Ambulatory Visit (HOSPITAL_COMMUNITY): Payer: Self-pay

## 2023-12-24 DIAGNOSIS — R9389 Abnormal findings on diagnostic imaging of other specified body structures: Secondary | ICD-10-CM

## 2023-12-24 MED ORDER — METRONIDAZOLE 500 MG PO TABS: 500.0000 mg | ORAL_TABLET | Freq: Two times a day (BID) | ORAL | 0 refills | Status: DC

## 2023-12-25 ENCOUNTER — Encounter: Payer: Self-pay | Admitting: Student

## 2023-12-25 ENCOUNTER — Telehealth: Payer: Self-pay

## 2023-12-25 ENCOUNTER — Other Ambulatory Visit (HOSPITAL_COMMUNITY): Payer: Self-pay

## 2023-12-25 DIAGNOSIS — N951 Menopausal and female climacteric states: Secondary | ICD-10-CM | POA: Insufficient documentation

## 2023-12-25 NOTE — Telephone Encounter (Signed)
 Rec'd PA for patients Veozah medication.  What's the dx/dx code for this medication?

## 2023-12-29 ENCOUNTER — Ambulatory Visit (HOSPITAL_COMMUNITY)
Admission: RE | Admit: 2023-12-29 | Discharge: 2023-12-29 | Disposition: A | Discharge status: Home / Self Care | Source: Ambulatory Visit | Attending: Family Medicine | Admitting: Family Medicine

## 2023-12-29 DIAGNOSIS — N950 Postmenopausal bleeding: Secondary | ICD-10-CM | POA: Diagnosis present

## 2023-12-29 LAB — CYTOLOGY - PAP
Adequacy: ABSENT
Chlamydia: NEGATIVE
Comment: NEGATIVE
Comment: NEGATIVE
Comment: NORMAL
Diagnosis: NEGATIVE
High risk HPV: NEGATIVE
Neisseria Gonorrhea: NEGATIVE

## 2023-12-31 NOTE — Telephone Encounter (Signed)
 Patient asking if there is any update on this authorization. Please contact her [phone]. Thanks

## 2024-01-01 NOTE — Telephone Encounter (Signed)
 Pharmacy Patient Advocate Encounter   Received notification from CoverMyMeds that prior authorization for [facility] is required/requested.   Insurance verification completed.   The patient is insured through [facility] MEDICAID .   Per test claim:  THE FOLLOWING is preferred by the insurance.  If suggested medication is appropriate, Please send in a new RX and discontinue this one. If not, please advise as to why it's not appropriate so that we may request a Prior Authorization. Please note, some preferred medications may still require a PA.  If the suggested medications have not been trialed and there are no contraindications to their use, the PA will not be submitted, as it will not be approved.

## 2024-01-11 ENCOUNTER — Ambulatory Visit: Admitting: Family Medicine

## 2024-01-11 ENCOUNTER — Other Ambulatory Visit (HOSPITAL_COMMUNITY)
Admission: RE | Admit: 2024-01-11 | Discharge: 2024-01-11 | Disposition: A | Discharge status: Home / Self Care | Source: Ambulatory Visit | Attending: Family Medicine | Admitting: Family Medicine

## 2024-01-11 VITALS — BP 106/72 | HR 65 | Ht 65.0 in | Wt 197.0 lb

## 2024-01-11 DIAGNOSIS — Z202 Contact with and (suspected) exposure to infections with a predominantly sexual mode of transmission: Secondary | ICD-10-CM

## 2024-01-11 DIAGNOSIS — N950 Postmenopausal bleeding: Secondary | ICD-10-CM | POA: Diagnosis not present

## 2024-01-11 DIAGNOSIS — Z113 Encounter for screening for infections with a predominantly sexual mode of transmission: Secondary | ICD-10-CM | POA: Diagnosis not present

## 2024-01-11 DIAGNOSIS — N898 Other specified noninflammatory disorders of vagina: Secondary | ICD-10-CM | POA: Diagnosis present

## 2024-01-11 DIAGNOSIS — L739 Follicular disorder, unspecified: Secondary | ICD-10-CM | POA: Diagnosis not present

## 2024-01-11 LAB — POCT WET PREP (WET MOUNT)
Clue Cells Wet Prep Whiff POC: NEGATIVE
Trichomonas Wet Prep HPF POC: ABSENT

## 2024-01-11 MED ORDER — METRONIDAZOLE 500 MG PO TABS: 500.0000 mg | ORAL_TABLET | Freq: Two times a day (BID) | ORAL | 0 refills | Status: DC

## 2024-01-11 MED ORDER — MUPIROCIN 2 % EX OINT: 1.0000 | TOPICAL_OINTMENT | Freq: Two times a day (BID) | CUTANEOUS | 1 refills | Status: AC

## 2024-01-11 NOTE — Assessment & Plan Note (Signed)
 Wet prep negative for BV and yeast.  Obtained G/C, will follow-up results.

## 2024-01-11 NOTE — Assessment & Plan Note (Signed)
 Now mostly stopped.  Pelvic US  results most consistent with endometrial polyp but does need endometrial biopsy to rule out insidious pathology.  Patient desires polyp removal, will refer to OB/GYN for further care. -Referral to OB/GYN -Will need endometrial biopsy, will defer to OB/GYN for further care

## 2024-01-11 NOTE — Progress Notes (Signed)
    SUBJECTIVE:   CHIEF COMPLAINT / HPI:   Trichomonas Tested positive on 12/22/2023, received treatment.  States her partner did not take treatment yet.  Last sexually active 2 days ago, confirmed repeat exposure.  Reports he is getting treatment through another doctor.  Having some vaginal itching and discharge.  Also reports she is having some bumps around her vulva.  Intermittently shaving.  Postmenopausal bleeding Seen 12/22/2023 for similar concern, ordered pelvic ultrasound and endometrial biopsy.  Pelvic ultrasound showed growth in the endometrial canal most consistent with endometrial polyp and small uterine fibroid.   Reports the bleeding has now stopped.  Also concerned because her hot flashes are significant, pending insurance authorization of Veozah .  PERTINENT  PMH / PSH: Postmenopausal  OBJECTIVE:   BP 106/72   Pulse 65   Ht 5\' 5"  (1.651 m)   Wt 197 lb (89.4 kg)   SpO2 100%   BMI 32.78 kg/m    General: NAD, pleasant, able to participate in exam Pelvic exam: VULVA: Pustules with surrounding erythema present on mons pubis, VAGINA: normal appearing vagina with normal color and discharge, no lesions, vaginal discharge - white and scant, exam chaperoned by Trula Gable, CMA.   ASSESSMENT/PLAN:   Assessment & Plan Post-menopausal bleeding Now mostly stopped.  Pelvic US  results most consistent with endometrial polyp but does need endometrial biopsy to rule out insidious pathology.  Patient desires polyp removal, will refer to OB/GYN for further care. -Referral to OB/GYN -Will need endometrial biopsy, will defer to OB/GYN for further care Folliculitis Present upon exam, will treat with topical mupirocin twice daily until resolved.  Advised avoiding shaving and other irritation to the area. Exposure to trichomonas Tested positive on 12/24/2023 and received treatment but partner was not treated and had recent intercourse.  Will retreat for trichomonas given likely  exposure. -Metronidazole  500 mg twice daily x 7 days -G/C, RPR, HIV, hep C Vaginal discharge Wet prep negative for BV and yeast.  Obtained G/C, will follow-up results.   Dr. Jonne Netters, DO [facility]

## 2024-01-11 NOTE — Patient Instructions (Addendum)
 It was wonderful to see you today! Thank you for choosing [facility].   Please bring ALL of your medications with you to every visit.   Today we talked about:  Please take the metronidazole  twice per day for the next 7 days for treatment for possible exposure to trichomonas.  I recommend abstaining from sexual activity until both you and your partner have been treated.  We will also do another test of cure in 1 month to make sure you have not been reinfected.  We also checking for the other STIs and you will see those results on MyChart and I will call you if there is any treatment indicated. For your pelvic ultrasound results I do recommend that you get an endometrial biopsy done. I will refer you to OBGYN and our office will call you with that information.  It is most likely that what they are seeing is a endometrial polyp which can cause bleeding. Topical antibiotic can be used twice per day until the area is gone for the folliculitis in the genital region.  Please follow up as needed for persistent symptoms   We are checking some labs today. If they are abnormal, I will call you. If they are normal, I will send you a MyChart message (if it is active) or a letter in the mail. If you do not hear about your labs in the next 2 weeks, please call the office.  Call the clinic at [phone] if your symptoms worsen or you have any concerns.  Please be sure to schedule follow up at the front desk before you leave today.   Jonne Netters, DO Family Medicine

## 2024-01-12 LAB — CERVICOVAGINAL ANCILLARY ONLY
Chlamydia: NEGATIVE
Comment: NEGATIVE
Comment: NEGATIVE
Comment: NORMAL
Neisseria Gonorrhea: NEGATIVE
Trichomonas: NEGATIVE

## 2024-01-12 LAB — HCV INTERPRETATION

## 2024-01-12 LAB — RPR: RPR Ser Ql: NONREACTIVE

## 2024-01-12 LAB — HIV ANTIBODY (ROUTINE TESTING W REFLEX): HIV Screen 4th Generation wRfx: NONREACTIVE

## 2024-01-12 LAB — HCV AB W REFLEX TO QUANT PCR: HCV Ab: NONREACTIVE

## 2024-01-13 ENCOUNTER — Ambulatory Visit: Payer: Self-pay | Admitting: Family Medicine

## 2024-01-26 ENCOUNTER — Encounter: Admitting: Obstetrics

## 2024-02-03 ENCOUNTER — Other Ambulatory Visit: Payer: Self-pay | Admitting: Student

## 2024-02-03 DIAGNOSIS — L739 Follicular disorder, unspecified: Secondary | ICD-10-CM

## 2024-02-05 ENCOUNTER — Telehealth: Payer: Self-pay | Admitting: Family Medicine

## 2024-02-05 NOTE — Telephone Encounter (Signed)
 Received request from [facility] pharmacy to send patient medications. Patient needs an appointment to discuss medications before refills are sent. Admin team: please schedule this for her. Thank you!

## 2024-02-09 ENCOUNTER — Ambulatory Visit: Admitting: Family Medicine

## 2024-04-11 ENCOUNTER — Telehealth: Payer: Self-pay

## 2024-04-11 NOTE — Telephone Encounter (Signed)
 Patient calls nurse line regarding medication for hot flashes.   She was previously prescribed Veozah  by Dr. Dameron on 12/22/23, however, is not covered by insurance.   Please see PA note from 12/25/23 for possible alternatives.   If appropriate, please send in new rx to [facility].   Chiquita JAYSON English, RN

## 2024-04-11 NOTE — Telephone Encounter (Signed)
 Patient will need an appointment to further discuss risks of medications before prescribing. Can you help her get scheduled for this?

## 2024-04-13 NOTE — Telephone Encounter (Signed)
 Called patient. PCP does not have any availability until October.   Patient requesting visit ASAP. Scheduled with Dr. Rosendo on Friday, 04/15/24.  Chiquita JAYSON English, RN

## 2024-04-15 ENCOUNTER — Ambulatory Visit (INDEPENDENT_AMBULATORY_CARE_PROVIDER_SITE_OTHER): Admitting: Student

## 2024-04-15 ENCOUNTER — Encounter: Payer: Self-pay | Admitting: Student

## 2024-04-15 ENCOUNTER — Telehealth: Payer: Self-pay

## 2024-04-15 VITALS — BP 104/77 | HR 67 | Temp 98.1°F | Ht 66.0 in | Wt 204.2 lb

## 2024-04-15 DIAGNOSIS — N951 Menopausal and female climacteric states: Secondary | ICD-10-CM | POA: Diagnosis present

## 2024-04-15 MED ORDER — BIJUVA 0.5-100 MG PO CAPS: 1.0000 | ORAL_CAPSULE | Freq: Every day | ORAL | 6 refills | Status: DC

## 2024-04-15 NOTE — Patient Instructions (Signed)
 Pleasure to see you today.  For your hot flashes a vasomotor symptoms due to perimenopause I have started you on Bijuva  which you take once daily and we will do this for 3 months.   You can also consider use of black cohosh supplements.

## 2024-04-15 NOTE — Assessment & Plan Note (Signed)
 Currently perimenopausal.  Unfortunately her insurance does not cover Veozah .  Discussed oral treatment options including supplements such as black cohosh, paroxetine and combo hormonal pill.  Patient expressed preference for hormonal medication - Rx Bijuva  (estrogen-progesterone combo pill) for 3 months  -patient to explore black cohosh supplement -Follow up in 3 months

## 2024-04-15 NOTE — Telephone Encounter (Signed)
 PA rec'd for patients Bijuva .   The following combo and oral/ transdermal estrogen agents are preferred by insurance:

## 2024-04-15 NOTE — Progress Notes (Signed)
    SUBJECTIVE:   CHIEF COMPLAINT / HPI:   53 year old female perimenopausal Presenting today for concerns of hot flashes Hot flashes have been present for at least 4 years but gotten worse in the past 3 months LMP was about 8 months ago Reports feeling hot and profuse sweating throughout the day Denies any mood or depression issues Previously prescribed Veozah  but could not get it because it was not covered by insurance No history of breast cancer or endometrial cancer. Grandmother had history of breast cancer but after her 57s  PERTINENT  PMH / PSH: Reviewed   OBJECTIVE:   BP 104/77   Pulse 67   Temp 98.1 F (36.7 C) (Oral)   Ht 5' 6 (1.676 m)   Wt 204 lb 3.2 oz (92.6 kg)   SpO2 100%   BMI 32.96 kg/m    Physical Exam General: Alert, well appearing, NAD Cardiovascular: RRR, No Murmurs, Normal S2/S2 Respiratory: CTAB, No wheezing or Rales Abdomen: No distension or tenderness  ASSESSMENT/PLAN:   Vasomotor symptoms due to menopause Currently perimenopausal.  Unfortunately her insurance does not cover Veozah .  Discussed oral treatment options including supplements such as black cohosh, paroxetine and combo hormonal pill.  Patient expressed preference for hormonal medication - Rx Bijuva  (estrogen-progesterone combo pill) for 3 months  -patient to explore black cohosh supplement -Follow up in 3 months  Norleen April, MD [facility]

## 2024-04-19 NOTE — Telephone Encounter (Signed)
 Submitted PA, awaiting clinical questions.   Insurance has lastname: [employer]

## 2024-04-20 NOTE — Telephone Encounter (Signed)
 Prior authorization submitted for BIJUVA  to [facility] MEDICAID via Latent.   Key: [id number]

## 2024-04-21 ENCOUNTER — Other Ambulatory Visit: Payer: Self-pay | Admitting: Student

## 2024-04-21 DIAGNOSIS — N951 Menopausal and female climacteric states: Secondary | ICD-10-CM

## 2024-04-21 MED ORDER — ESTRADIOL-NORETHINDRONE ACET 0.5-0.1 MG PO TABS: 1.0000 | ORAL_TABLET | Freq: Every day | ORAL | 4 refills | Status: AC

## 2024-04-21 NOTE — Telephone Encounter (Signed)
 Pharmacy Patient Advocate Encounter  Received notification from [facility] MEDICAID that Prior Authorization for BIJUVA  has been DENIED.  Full denial letter will be uploaded to the media tab. See denial reason below.  Did not meet step therapy criteria requiring failure of preferred drugs including Activella, Amabelz , Fyavolv, Jinteli, or Premphase.   Per your health plan's criteria, this drug is covered if you meet the following:  One of the following:  (1) You have failed two preferred drugs as confirmed by claims history or submission of medical records. The preferred drugs: Activella tablet, Amabelz  tablet, Fyavolv tablet, Jinteli (branded generic for [facility]), Premphase tablet.  (2) You cannot use two preferred drugs (please specify contraindication or intolerance).   PA #/Case ID/Reference #: [id number]

## 2024-04-21 NOTE — Progress Notes (Signed)
 Discontinued Bijuva  and sent in Activella 0.5/0.1 daily per insurance preference

## 2024-08-23 ENCOUNTER — Telehealth: Payer: Self-pay

## 2024-08-23 NOTE — Telephone Encounter (Signed)
 Katie with [facility] calls nurse line to confirm PCP.   She reports she will be faxing over patients screening mammogram to our office.   She reports the patient will need a diagnostic mammogram order placed.   She reports next steps will be in the fax.   Will forward to PCP to make aware.

## 2024-08-23 NOTE — Telephone Encounter (Signed)
 Order for mammogram, +/- ultrasound if indicated, signed and placed in to-be-faxed pile for patient's indeterminate screening mammogram. [facility] to call and schedule patient.
